# Patient Record
Sex: Female | Born: 1995 | Race: White | Hispanic: No | Marital: Single | Smoking: Never smoker
Health system: Southern US, Community
[De-identification: ages and names within clinical notes are randomized; demographics above are authoritative.]

## PROBLEM LIST (undated history)

## (undated) DIAGNOSIS — F419 Anxiety disorder, unspecified: Secondary | ICD-10-CM

## (undated) DIAGNOSIS — K219 Gastro-esophageal reflux disease without esophagitis: Secondary | ICD-10-CM

## (undated) DIAGNOSIS — Z9889 Other specified postprocedural states: Secondary | ICD-10-CM

## (undated) DIAGNOSIS — M542 Cervicalgia: Secondary | ICD-10-CM

## (undated) DIAGNOSIS — F32A Depression, unspecified: Secondary | ICD-10-CM

## (undated) DIAGNOSIS — R112 Nausea with vomiting, unspecified: Secondary | ICD-10-CM

## (undated) DIAGNOSIS — R0600 Dyspnea, unspecified: Secondary | ICD-10-CM

## (undated) DIAGNOSIS — Z87442 Personal history of urinary calculi: Secondary | ICD-10-CM

## (undated) HISTORY — PX: WISDOM TOOTH EXTRACTION: SHX21

---
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
## 2013-06-23 ENCOUNTER — Telehealth: Payer: Self-pay | Admitting: Family Medicine

## 2013-06-23 ENCOUNTER — Ambulatory Visit (INDEPENDENT_AMBULATORY_CARE_PROVIDER_SITE_OTHER): Payer: Commercial Indemnity | Admitting: Family Medicine

## 2013-06-23 VITALS — BP 107/73 | HR 76 | Temp 99.1°F | Ht 61.0 in | Wt 103.6 lb

## 2013-06-23 DIAGNOSIS — J069 Acute upper respiratory infection, unspecified: Secondary | ICD-10-CM

## 2013-06-23 DIAGNOSIS — H109 Unspecified conjunctivitis: Secondary | ICD-10-CM

## 2013-06-23 MED ORDER — AZITHROMYCIN 250 MG PO TABS: ORAL_TABLET | ORAL | Status: DC

## 2013-06-23 MED ORDER — POLYMYXIN B-TRIMETHOPRIM 10000-0.1 UNIT/ML-% OP SOLN: 1.0000 [drp] | OPHTHALMIC | Status: DC

## 2013-06-23 NOTE — Progress Notes (Signed)
   Subjective:    Patient ID: Alice Ward, female    DOB: 21-Dec-1995, 18 y.o.   MRN: [id number]  HPI This 18 y.o. female presents for evaluation of uri sx's and bilateral eye itching and allergies.  She has been taking nasal spray, antihistamines and singulair and it is not working and she thinks she might want to see an allergist but wants to talk to her father first.   Review of Systems C/o allergy sx's No chest pain, SOB, HA, dizziness, vision change, N/V, diarrhea, constipation, dysuria, urinary urgency or frequency, myalgias, arthralgias or rash.     Objective:   Physical Exam  Vital signs noted  Well developed well nourished female.  HEENT - Head atraumatic Normocephalic                Eyes - PERRLA, Conjuctiva - clear Sclera- Clear EOMI                Ears - EAC's Wnl TM's Wnl Gross Hearing WNL                Nose - Nares patent                 Throat - oropharanx wnl Respiratory - Lungs CTA bilateral Cardiac - RRR S1 and S2 without murmur GI - Abdomen soft Nontender and bowel sounds active x 4 Extremities - No edema. Neuro - Grossly intact.      Assessment & Plan:  URI (upper respiratory infection) - Plan: azithromycin (ZITHROMAX) 250 MG tablet, trimethoprim-polymyxin b (POLYTRIM) ophthalmic solution  Conjunctivitis, unspecified laterality - Plan: azithromycin (ZITHROMAX) 250 MG tablet, trimethoprim-polymyxin b (POLYTRIM) ophthalmic solution  SAR - Discussed referral to allergist but she wants to wait and discuss with her father.  Deatra CanterWilliam J Oxford FNP

## 2013-06-24 ENCOUNTER — Other Ambulatory Visit: Payer: Self-pay | Admitting: Family Medicine

## 2013-06-24 DIAGNOSIS — J302 Other seasonal allergic rhinitis: Secondary | ICD-10-CM

## 2013-08-19 ENCOUNTER — Ambulatory Visit (INDEPENDENT_AMBULATORY_CARE_PROVIDER_SITE_OTHER): Payer: Managed Care, Other (non HMO) | Admitting: Internal Medicine

## 2013-08-19 ENCOUNTER — Encounter (INDEPENDENT_AMBULATORY_CARE_PROVIDER_SITE_OTHER): Payer: Self-pay

## 2013-08-19 ENCOUNTER — Other Ambulatory Visit: Payer: Managed Care, Other (non HMO)

## 2013-08-19 ENCOUNTER — Encounter: Payer: Self-pay | Admitting: Internal Medicine

## 2013-08-19 VITALS — BP 116/60 | HR 87 | Ht 61.0 in | Wt 102.8 lb

## 2013-08-19 DIAGNOSIS — H1012 Acute atopic conjunctivitis, left eye: Secondary | ICD-10-CM

## 2013-08-19 DIAGNOSIS — H049 Disorder of lacrimal system, unspecified: Secondary | ICD-10-CM

## 2013-08-19 DIAGNOSIS — H1045 Other chronic allergic conjunctivitis: Secondary | ICD-10-CM

## 2013-08-19 NOTE — Patient Instructions (Signed)
Order- lab- Allergy profile    Dx allergic conjunctivitis  Recommend you see an Opthalmologist (MD) about your overflow lacrimation. I don't expect this to turn out to be an allergy problem affecting just one eye over so many years, but see what the eye doctor thinks.

## 2013-08-19 NOTE — Progress Notes (Signed)
08/19/13- 18 yoF never smoker referred courtesy of Dr Nils Pyle; left eye waters year round;has tried nasal sprays, antihistamines, and eye drops with no relief. Also has tried going without make up for period of time and this was not the cause of watery eyes.  Mother here She and her mother say that the left eye has watered since she was a small child. Is worse in wind but otherwise no seasonal pattern or exposure seems significant. She's had minor seasonal nasal congestion with no history of significant a copy including hives or asthma. She had had some minor trauma with a fall striking the bridge of her nose many years ago. Wisdom teeth extracted otherwise no ENT surgery. Other medical and family history is unremarkable.  She denies pregnancy  Prior to Admission medications   Not on File   Past Medical History  Diagnosis Date  . Neck pain    Past Surgical History  Procedure Laterality Date  . Wisdom tooth extraction     Family History  Problem Relation Age of Onset  . Cancer Mother   . Allergies Mother   . Allergies      both sets of grandparents  . Heart disease      mother's side-grandparents  . Cancer      both sets of grandparents (bone,lung,pancreas,skin)   History   Social History  . Marital Status: Single    Spouse Name: N/A    Number of Children: 0  . Years of Education: N/A   Occupational History  . cashier    Social History Main Topics  . Smoking status: Never Smoker   . Smokeless tobacco: Never Used  . Alcohol Use: No  . Drug Use: No  . Sexual Activity: Yes    Birth Control/ Protection: Condom   Other Topics Concern  . Not on file   Social History Narrative  . No narrative on file   ROS-see HPI Constitutional:   No-   weight loss, night sweats, fevers, chills, fatigue, lassitude. HEENT:   No-  headaches, difficulty swallowing, tooth/dental problems, sore throat,       No-  sneezing, itching, ear ache, nasal congestion, post nasal drip,  CV:   No-   chest pain, orthopnea, PND, swelling in lower extremities, anasarca,                                  dizziness, palpitations Resp: No-   shortness of breath with exertion or at rest.              No-   productive cough,  No non-productive cough,  No- coughing up of blood.              No-   change in color of mucus.  No- wheezing.   Skin: No-   rash or lesions. GI:  No-   heartburn, indigestion, abdominal pain, nausea, vomiting, diarrhea,                 change in bowel habits, loss of appetite GU: No-   dysuria, change in color of urine, no urgency or frequency.  No- flank pain. MS:  No-   joint pain or swelling.  No- decreased range of motion.  No- back pain. Neuro-     nothing unusual Psych:  No- change in mood or affect. No depression or anxiety.  No memory loss.  OBJ- Physical Exam General- Alert,  Oriented, Affect-appropriate, Distress- none acute Skin- rash-none, lesions- none, excoriation- none. [place]. Lymphadenopathy- none Head- atraumatic            Eyes- Gross vision intact, PERRLA, conjunctivae and secretions clear. Not obviously                          irritated or watery            Ears- Hearing, canals-normal            Nose- Clear, no-Septal dev, mucus, polyps, erosion, perforation             Throat- Mallampati II , mucosa clear , drainage- none, tonsils- atrophic, +tongue stud Neck- flexible , trachea midline, no stridor , thyroid nl, carotid no bruit Chest - symmetrical excursion , unlabored           Heart/CV- RRR , no murmur , no gallop  , no rub, nl s1 s2                           - JVD- none , edema- none, stasis changes- none, varices- none           Lung- clear to P&A, wheeze- none, cough- none , dullness-none, rub- none           Chest wall-  Abd- tender-no, distended-no, bowel sounds-present, HSM- no Br/ Gen/ Rectal- Not done, not indicated Extrem- cyanosis- none, clubbing, none, atrophy- none, strength- nl Neuro- grossly intact to  observation

## 2013-08-20 LAB — ALLERGY FULL PROFILE
Allergen,Goose feathers, e70: <0.1 kU/L
Alternaria Alternata: <0.1 kU/L
Bahia Grass: <0.1 kU/L
Cat Dander: <0.1 kU/L
Common Ragweed: <0.1 kU/L
Curvularia lunata: <0.1 kU/L
Dog Dander: <0.1 kU/L
Fescue: <0.1 kU/L
Goldenrod: <0.1 kU/L
Helminthosporium halodes: <0.1 kU/L
IgE (Immunoglobulin E), Serum: 19.6 IU/mL (ref 0.0–180.0)
Lamb's Quarters: <0.1 kU/L
Stemphylium Botryosum: <0.1 kU/L
Sycamore Tree: <0.1 kU/L

## 2013-08-24 DIAGNOSIS — H049 Disorder of lacrimal system, unspecified: Secondary | ICD-10-CM | POA: Insufficient documentation

## 2013-08-24 NOTE — Assessment & Plan Note (Signed)
I doubt this is an atopic problem. We can check an allergy profile for IgE antibody panel, but I have recommended referral to an ophthalmologist. 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

## 2013-09-09 ENCOUNTER — Ambulatory Visit: Payer: Commercial Indemnity

## 2013-09-15 ENCOUNTER — Ambulatory Visit (INDEPENDENT_AMBULATORY_CARE_PROVIDER_SITE_OTHER): Payer: Commercial Indemnity | Admitting: *Deleted

## 2013-09-15 DIAGNOSIS — Z111 Encounter for screening for respiratory tuberculosis: Secondary | ICD-10-CM

## 2013-09-15 NOTE — Patient Instructions (Signed)

## 2013-09-17 LAB — TB SKIN TEST
Induration: 0 mm
TB SKIN TEST: NEGATIVE

## 2013-09-23 ENCOUNTER — Ambulatory Visit: Payer: Commercial Indemnity | Admitting: Nurse Practitioner

## 2013-09-25 ENCOUNTER — Ambulatory Visit (INDEPENDENT_AMBULATORY_CARE_PROVIDER_SITE_OTHER): Payer: Commercial Indemnity | Admitting: Nurse Practitioner

## 2013-09-25 ENCOUNTER — Encounter: Payer: Self-pay | Admitting: Nurse Practitioner

## 2013-09-25 VITALS — BP 104/70 | HR 78 | Temp 97.6°F | Ht 61.0 in | Wt 102.0 lb

## 2013-09-25 DIAGNOSIS — Z0000 Encounter for general adult medical examination without abnormal findings: Secondary | ICD-10-CM

## 2013-09-25 DIAGNOSIS — Z0289 Encounter for other administrative examinations: Secondary | ICD-10-CM

## 2013-09-25 NOTE — Patient Instructions (Signed)

## 2013-09-25 NOTE — Progress Notes (Signed)
   Subjective:    Patient ID: Alice Ward, female    DOB: 22-Mar-1995, 18 y.o.   MRN: [id number]  HPI Patient in to have a physical for work. SHe has no medical problems and is on no meds.    Review of Systems  Constitutional: Negative.   HENT: Negative.   Eyes: Positive for discharge.  Respiratory: Negative.   Cardiovascular: Negative.   Genitourinary: Negative.   Neurological: Negative.   Psychiatric/Behavioral: Negative.   All other systems reviewed and are negative.      Objective:   Physical Exam  Constitutional: She is oriented to person, place, and time. She appears well-developed and well-nourished.  Cardiovascular: Normal rate.   Pulmonary/Chest: Effort normal and breath sounds normal.  Neurological: She is alert and oriented to person, place, and time.  Skin: Skin is warm and dry.  Psychiatric: She has a normal mood and affect. Her behavior is normal. Judgment and thought content normal.   BP 104/70  Pulse 78  Temp(Src) 97.6 F (36.4 C) (Oral)  Ht  (1.549 m)  Wt 102 lb (46.267 kg)  BMI 19.28 kg/m2        Assessment & Plan:   1. Other general medical examination for administrative purposes    Exercise Low fat diet Follow up in 1 year and prn  Mary-Margaret Daphine Deutscher, FNP

## 2014-08-03 IMAGING — CR DG CERVICAL SPINE COMPLETE 4+V
5 series · 5 of 5 positions shown · non-contrast
Comparison: None.

CLINICAL DATA: Burning on the left side of the neck.

CERVICAL SPINE - COMPLETE 4+ VIEW

[view not recorded (1 of 5)]
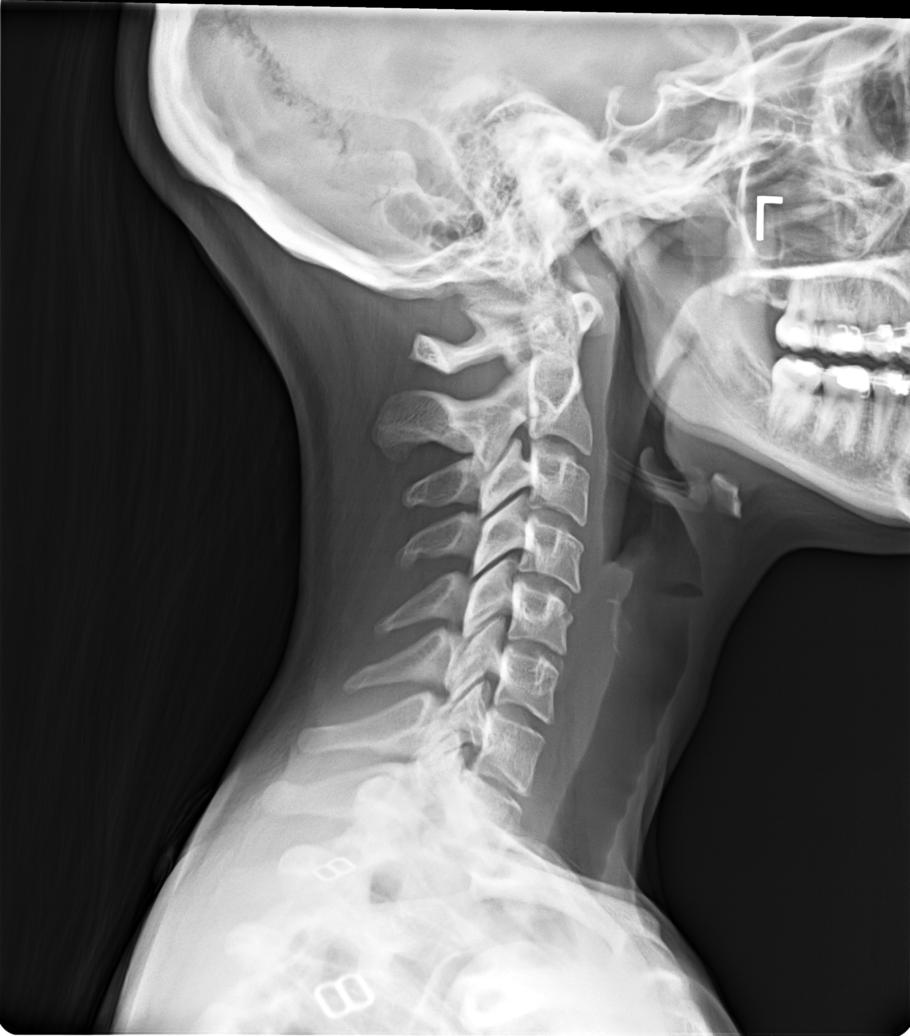

[view not recorded (2 of 5)]
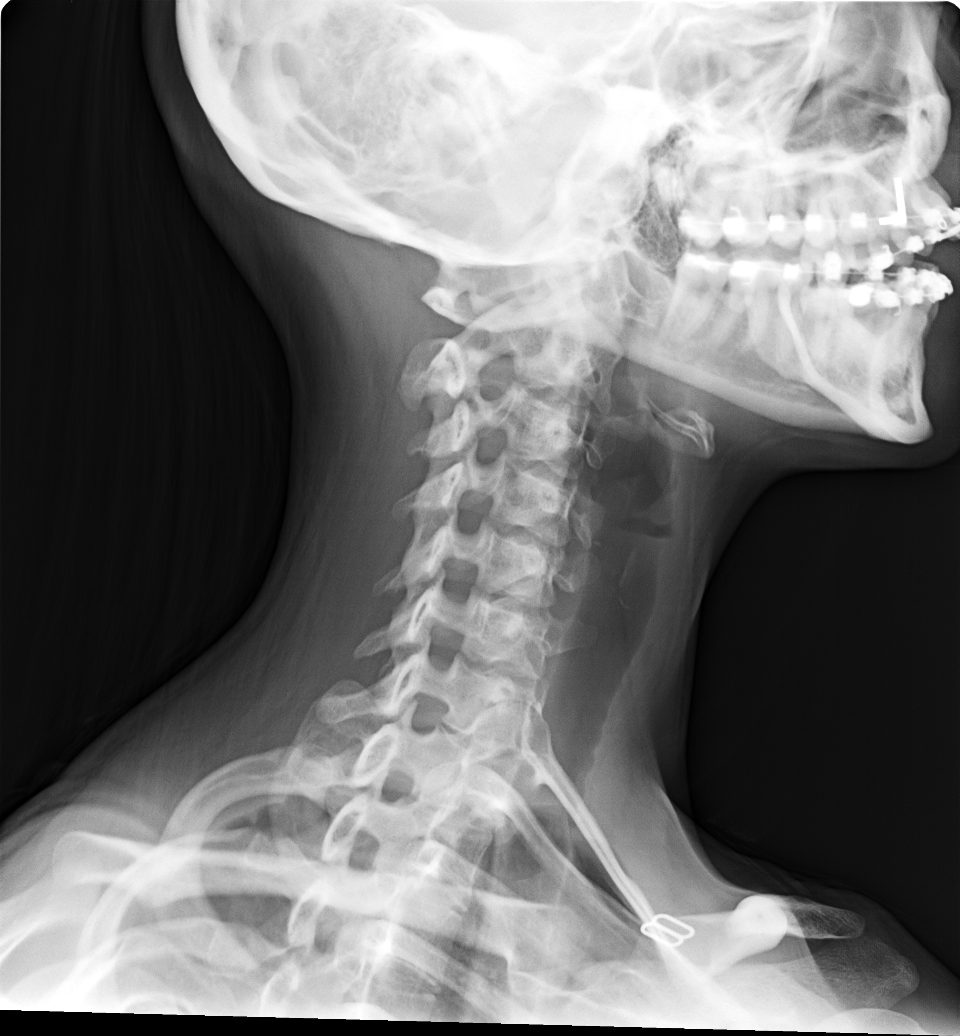

[view not recorded (3 of 5)]
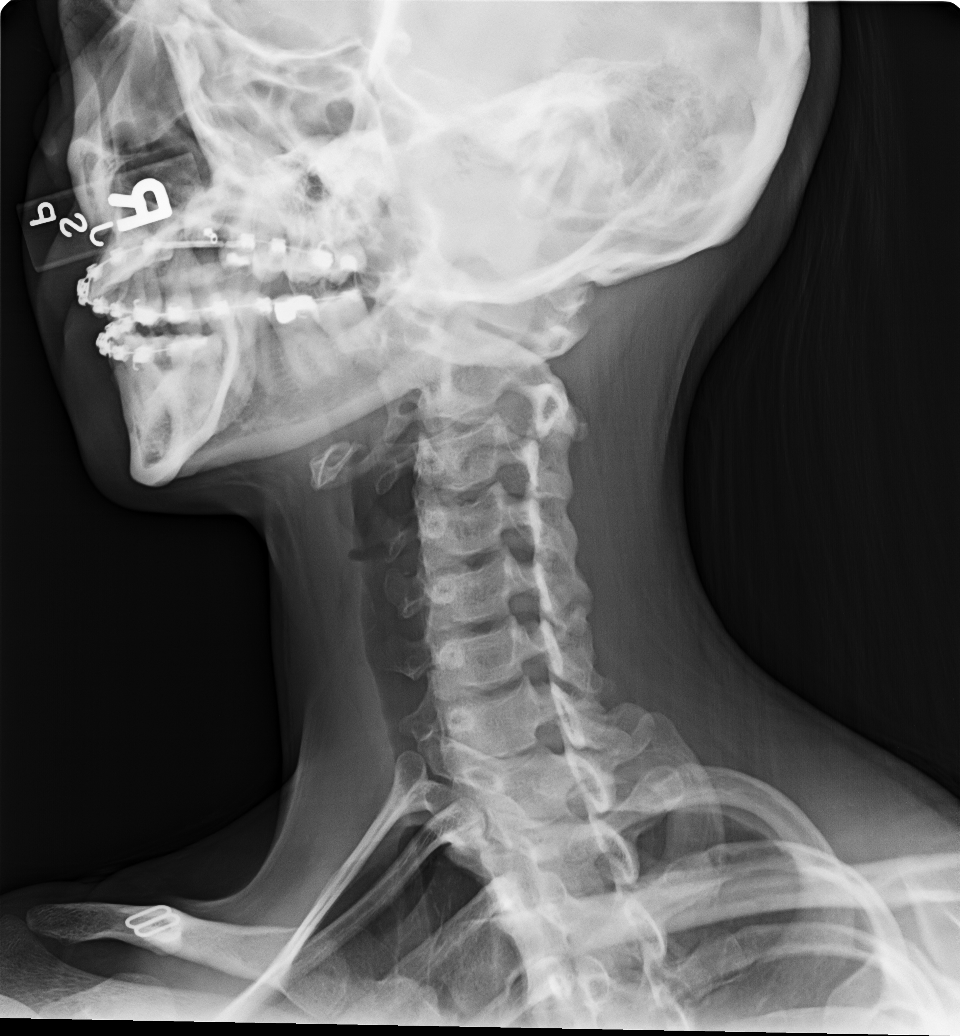

[view not recorded (4 of 5)]
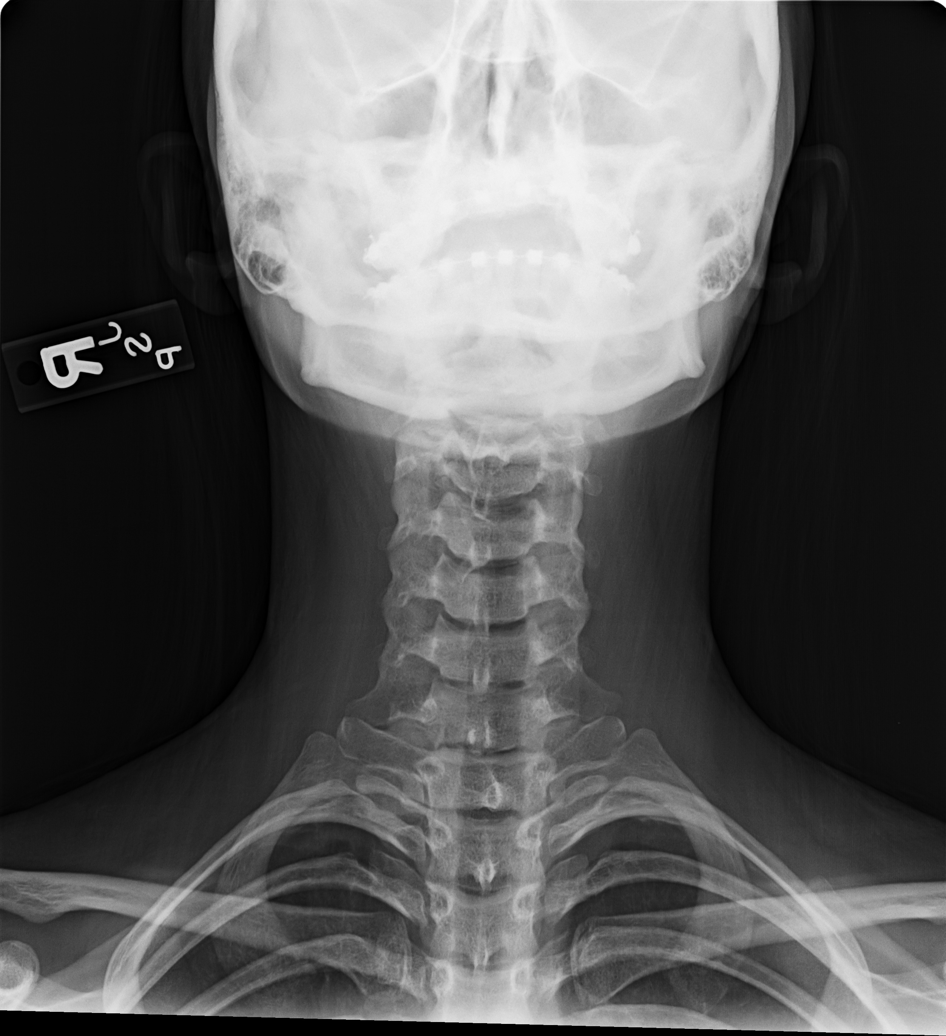

[view not recorded (5 of 5)]
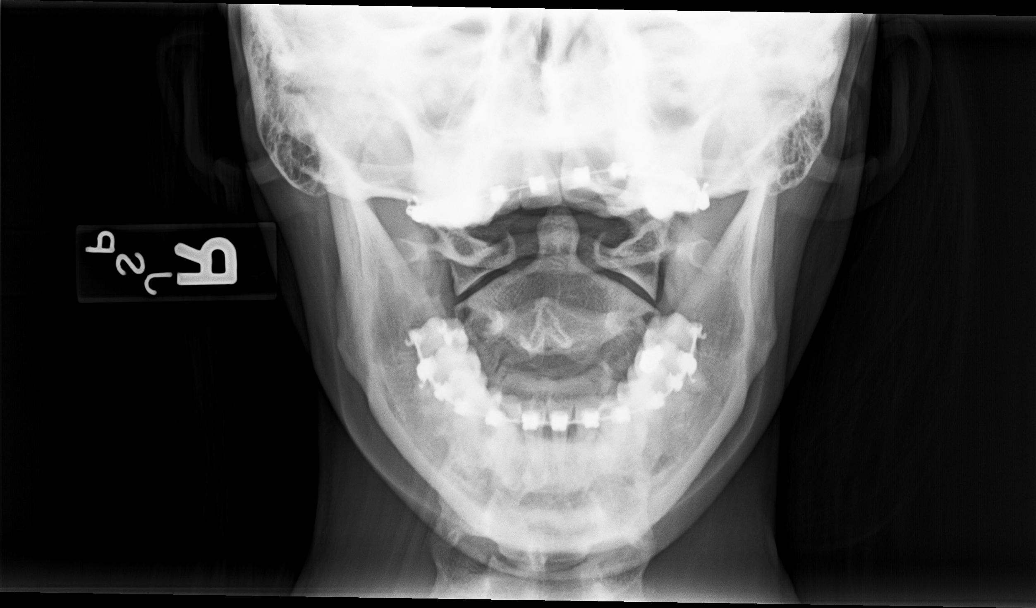

[5 of 5 positions shown; findings below may reference images not displayed]

FINDINGS: Anatomic alignment.  No fracture.  Prevertebral soft
tissues are normal.  Cervicothoracic junction appears normal.
Craniocervical alignment normal.  No bony foraminal stenosis.
IMPRESSION: Negative.

## 2014-11-11 ENCOUNTER — Encounter: Payer: Self-pay | Admitting: Family Medicine

## 2014-11-11 ENCOUNTER — Ambulatory Visit (INDEPENDENT_AMBULATORY_CARE_PROVIDER_SITE_OTHER): Payer: Commercial Indemnity | Admitting: Family Medicine

## 2014-11-11 VITALS — BP 126/85 | HR 142 | Temp 103.1°F | Ht 61.0 in | Wt 119.6 lb

## 2014-11-11 DIAGNOSIS — J029 Acute pharyngitis, unspecified: Secondary | ICD-10-CM | POA: Diagnosis not present

## 2014-11-11 DIAGNOSIS — J329 Chronic sinusitis, unspecified: Secondary | ICD-10-CM | POA: Diagnosis not present

## 2014-11-11 DIAGNOSIS — J40 Bronchitis, not specified as acute or chronic: Secondary | ICD-10-CM

## 2014-11-11 LAB — POCT RAPID STREP A (OFFICE): RAPID STREP A SCREEN: NEGATIVE

## 2014-11-11 MED ORDER — LEVOFLOXACIN 500 MG PO TABS: 500.0000 mg | ORAL_TABLET | Freq: Every day | ORAL | Status: DC

## 2014-11-11 MED ORDER — GUAIFENESIN-CODEINE 100-10 MG/5ML PO SYRP: 5.0000 mL | ORAL_SOLUTION | ORAL | Status: DC | PRN

## 2014-11-11 NOTE — Patient Instructions (Signed)
Rest at home, out of work through 11/13/2014

## 2014-11-11 NOTE — Progress Notes (Signed)
Subjective:  Patient ID: Hazeline Bachrach, female    DOB: [birth year]  Age: 19 y.o. MRN: [id number]  CC: Sore Throat   HPI Jennifer Knueppel presents for Patient presents with upper respiratory congestion. Rhinorrhea that is frequently purulent. Had some blood this AM early.There is severe sore throat. Patient reports coughing frequently as well.-colored/purulent sputum noted. There is fever &chills & sweats. The patient denies being short of breath. Onset was 3 days ago. Gradually worsening in spite of home remedies.   History Angala has a past medical history of Neck pain.   She has past surgical history that includes Wisdom tooth extraction.   Her family history includes Allergies in her mother and another family member; Cancer in her mother and another family member; Heart disease in an other family member.She reports that she has never smoked. She has never used smokeless tobacco. She reports that she does not drink alcohol or use illicit drugs.  No outpatient prescriptions prior to visit.   No facility-administered medications prior to visit.    ROS Review of Systems  Constitutional: Negative for fever, chills, activity change and appetite change.  HENT: Positive for congestion, postnasal drip, rhinorrhea and sinus pressure. Negative for ear discharge, ear pain, hearing loss, nosebleeds, sneezing and trouble swallowing.   Respiratory: Negative for chest tightness and shortness of breath.   Cardiovascular: Negative for chest pain and palpitations.  Skin: Negative for rash.    Objective:  BP 126/85 mmHg  Pulse 142  Temp(Src) 103.1 F (39.5 C) (Oral)  Ht 5\' 1"  (1.549 m)  Wt 119 lb 9.6 oz (54.25 kg)  BMI 22.61 kg/m2  SpO2 99%  LMP 10/11/2014  BP Readings from Last 3 Encounters:  11/11/14 126/85  09/25/13 104/70  08/19/13 116/60    Wt Readings from Last 3 Encounters:  11/11/14 119 lb 9.6 oz (54.25 kg)  09/25/13 102 lb (46.267 kg)  08/19/13 102 lb 12.8 oz (46.63 kg)      Physical Exam  Constitutional: She appears well-developed and well-nourished.  HENT:  Head: Normocephalic and atraumatic.  Right Ear: Tympanic membrane and external ear normal. No decreased hearing is noted.  Left Ear: Tympanic membrane and external ear normal. No decreased hearing is noted.  Nose: Mucosal edema present. Right sinus exhibits no frontal sinus tenderness. Left sinus exhibits no frontal sinus tenderness.  Mouth/Throat: No oropharyngeal exudate or posterior oropharyngeal erythema.  Pharynx bright red in streaks  Neck: No Brudzinski's sign noted.  Cardiovascular: Normal rate and regular rhythm.   Pulmonary/Chest: Breath sounds normal. No respiratory distress.  Lymphadenopathy:       Head (right side): No preauricular adenopathy present.       Head (left side): No preauricular adenopathy present.       Right cervical: No superficial cervical adenopathy present.      Left cervical: No superficial cervical adenopathy present.    No results found for: HGBA1C  No results found for: WBC, HGB, HCT, PLT, GLUCOSE, CHOL, TRIG, HDL, LDLDIRECT, LDLCALC, ALT, AST, NA, K, CL, CREATININE, BUN, CO2, TSH, PSA, INR, GLUF, HGBA1C, MICROALBUR  Mr Cervical Spine Wo Contrast  02/12/2012  *RADIOLOGY REPORT* Clinical Data: Neck pain radiating to the left arm. MRI CERVICAL SPINE WITHOUT CONTRAST Technique:  Multiplanar and multiecho pulse sequences of the cervical spine, to include the craniocervical junction and cervicothoracic junction, were obtained according to standard protocol without intravenous contrast. Comparison: None. Findings: There is artifact about the upper margin of the study from the patient's braces.  There is  also motion on the study. Given these limitations, vertebral body height, signal and alignment appear normal.  The craniocervical junction appears normal.  Cervical cord signal is unremarkable.  No disc bulge or protrusion is identified.  The central spinal canal and  neural foramina appear widely patent at all levels.  Paraspinous structures are unremarkable. IMPRESSION: Negative examination. Original Report Authenticated By: Holley Dexter, M.D.    Assessment & Plan:   Ariebella was seen today for sore throat.  Diagnoses and all orders for this visit:  Sore throat -     POCT rapid strep A -     Culture, Group A Strep  Sinobronchitis -     Culture, Group A Strep  Other orders -     levofloxacin (LEVAQUIN) 500 MG tablet; Take 1 tablet (500 mg total) by mouth daily. -     guaiFENesin-codeine (CHERATUSSIN AC) 100-10 MG/5ML syrup; Take 5 mLs by mouth every 4 (four) hours as needed for cough.   I am having Ms. Canady start on levofloxacin and guaiFENesin-codeine.  Meds ordered this encounter  Medications  . levofloxacin (LEVAQUIN) 500 MG tablet    Sig: Take 1 tablet (500 mg total) by mouth daily.    Dispense:  10 tablet    Refill:  0  . guaiFENesin-codeine (CHERATUSSIN AC) 100-10 MG/5ML syrup    Sig: Take 5 mLs by mouth every 4 (four) hours as needed for cough.    Dispense:  180 mL    Refill:  0     Follow-up: Return if symptoms worsen or fail to improve.  Mechele Claude, M.D.

## 2014-11-13 LAB — CULTURE, GROUP A STREP: STREP A CULTURE: NEGATIVE

## 2015-08-25 ENCOUNTER — Other Ambulatory Visit: Payer: Self-pay | Admitting: Family Medicine

## 2015-08-25 ENCOUNTER — Telehealth: Payer: Self-pay | Admitting: Family Medicine

## 2015-08-25 MED ORDER — PROMETHAZINE HCL 25 MG PO TABS: 25.0000 mg | ORAL_TABLET | Freq: Three times a day (TID) | ORAL | 1 refills | Status: DC | PRN

## 2015-08-25 NOTE — Telephone Encounter (Signed)
Please review and advise.

## 2015-08-25 NOTE — Telephone Encounter (Signed)
The requested med has been sent to the pharmacy.  Please let the patient know. Thanks, WS 

## 2015-08-26 MED ORDER — PROMETHAZINE HCL 25 MG PO TABS: 25.0000 mg | ORAL_TABLET | Freq: Three times a day (TID) | ORAL | 1 refills | Status: DC | PRN

## 2016-04-05 ENCOUNTER — Ambulatory Visit: Payer: Managed Care, Other (non HMO) | Admitting: Family Medicine

## 2016-04-19 ENCOUNTER — Ambulatory Visit: Payer: Managed Care, Other (non HMO) | Admitting: Family Medicine

## 2018-09-30 ENCOUNTER — Other Ambulatory Visit: Payer: Self-pay

## 2018-09-30 DIAGNOSIS — Z20822 Contact with and (suspected) exposure to covid-19: Secondary | ICD-10-CM

## 2018-10-01 LAB — NOVEL CORONAVIRUS, NAA: SARS-CoV-2, NAA: NOT DETECTED

## 2019-02-11 ENCOUNTER — Other Ambulatory Visit: Payer: Self-pay

## 2019-02-11 ENCOUNTER — Emergency Department (HOSPITAL_COMMUNITY)
Admission: EM | Admit: 2019-02-11 | Discharge: 2019-02-12 | Disposition: A | Discharge status: Home / Self Care | Payer: 59 | Attending: Emergency Medicine | Admitting: Emergency Medicine

## 2019-02-11 ENCOUNTER — Encounter (HOSPITAL_COMMUNITY): Payer: Self-pay | Admitting: Emergency Medicine

## 2019-02-11 ENCOUNTER — Emergency Department (HOSPITAL_COMMUNITY): Payer: 59

## 2019-02-11 DIAGNOSIS — R0602 Shortness of breath: Secondary | ICD-10-CM | POA: Diagnosis not present

## 2019-02-11 DIAGNOSIS — R079 Chest pain, unspecified: Secondary | ICD-10-CM

## 2019-02-11 DIAGNOSIS — R0981 Nasal congestion: Secondary | ICD-10-CM

## 2019-02-11 DIAGNOSIS — R0789 Other chest pain: Secondary | ICD-10-CM | POA: Diagnosis present

## 2019-02-11 DIAGNOSIS — R000 Tachycardia, unspecified: Secondary | ICD-10-CM | POA: Diagnosis not present

## 2019-02-11 DIAGNOSIS — R9081 Abnormal echoencephalogram: Secondary | ICD-10-CM | POA: Insufficient documentation

## 2019-02-11 DIAGNOSIS — Z79899 Other long term (current) drug therapy: Secondary | ICD-10-CM | POA: Insufficient documentation

## 2019-02-11 DIAGNOSIS — Z20822 Contact with and (suspected) exposure to covid-19: Secondary | ICD-10-CM | POA: Insufficient documentation

## 2019-02-11 LAB — CBC
HCT: 40.8 % (ref 36.0–46.0)
Hemoglobin: 13.6 g/dL (ref 12.0–15.0)
MCH: 29.3 pg (ref 26.0–34.0)
MCHC: 33.3 g/dL (ref 30.0–36.0)
MCV: 87.9 fL (ref 80.0–100.0)
Platelets: 308 10*3/uL (ref 150–400)
RBC: 4.64 MIL/uL (ref 3.87–5.11)
RDW: 12.7 % (ref 11.5–15.5)
WBC: 6.7 10*3/uL (ref 4.0–10.5)
nRBC: 0 % (ref 0.0–0.2)

## 2019-02-11 LAB — BASIC METABOLIC PANEL
Anion gap: 8 (ref 5–15)
BUN: 10 mg/dL (ref 6–20)
CO2: 26 mmol/L (ref 22–32)
Calcium: 9.1 mg/dL (ref 8.9–10.3)
Chloride: 106 mmol/L (ref 98–111)
Creatinine, Ser: 0.63 mg/dL (ref 0.44–1.00)
GFR calc Af Amer: >60 mL/min (ref >60)
GFR calc non Af Amer: >60 mL/min (ref >60)
Glucose, Bld: 98 mg/dL (ref 70–99)
Potassium: 3.9 mmol/L (ref 3.5–5.1)
Sodium: 140 mmol/L (ref 135–145)

## 2019-02-11 LAB — I-STAT BETA HCG BLOOD, ED (MC, WL, AP ONLY): I-stat hCG, quantitative: <5 m[IU]/mL (ref <5)

## 2019-02-11 LAB — TROPONIN I (HIGH SENSITIVITY): Troponin I (High Sensitivity): 4 ng/L (ref <18)

## 2019-02-11 MED ORDER — SODIUM CHLORIDE 0.9% FLUSH: 3.0000 mL | Freq: Once | INTRAVENOUS | Status: DC

## 2019-02-11 NOTE — ED Triage Notes (Signed)
Pt to ED with c/o rapid heart rate and lower chest pain off and on since yesterday

## 2019-02-12 LAB — D-DIMER, QUANTITATIVE: D-Dimer, Quant: 0.37 ug/mL-FEU (ref 0.00–0.50)

## 2019-02-12 LAB — TROPONIN I (HIGH SENSITIVITY): Troponin I (High Sensitivity): <2 ng/L (ref <18)

## 2019-02-12 NOTE — ED Provider Notes (Signed)
[facility] EMERGENCY DEPARTMENT Provider Note   CSN: [id number] Arrival date & time: 02/11/19  2028     History Chief Complaint  Patient presents with  . Chest Pain    Alice Ward is a 24 y.o. female.  Patient presents to the emergency department with a chief complaint of chest pain.  She reports that she was seen in urgent care earlier this week after having had a rapid heart rate.  She states that her heart rate was in the 160s.  She reports having some shortness of breath.  She denies any stimulant use.  Denies any history of PE or DVT.  Denies any recent travel or surgery.  Denies any calf pain or leg swelling.  She states that urgent care encouraged her to have evaluation in the emergency department.  She states that she has had some sinus congestion, but denies any fever, chills, or cough.  The history is provided by the patient. No language interpreter was used.       Past Medical History:  Diagnosis Date  . Neck pain     Patient Active Problem List   Diagnosis Date Noted  . 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 WISDOM TOOTH EXTRACTION       OB History   No obstetric history on file.     Family History  Problem Relation Age of Onset  . Cancer Mother   . Allergies Mother   . Allergies Other        both sets of grandparents  . Heart disease Other        mother's side-grandparents  . Cancer Other        both sets of grandparents (bone,lung,pancreas,skin)    Social History   Tobacco Use  . Smoking status: Never Smoker  . Smokeless tobacco: Never Used  Substance Use Topics  . Alcohol use: No  . Drug use: No    Home Medications Prior to Admission medications   Medication Sig Start Date End Date Taking? Authorizing Provider  guaiFENesin-codeine (CHERATUSSIN AC) 100-10 MG/5ML syrup Take 5 mLs by mouth every 4 (four) hours as needed for cough. 11/11/14   Claretta Fraise, MD    levofloxacin (LEVAQUIN) 500 MG tablet Take 1 tablet (500 mg total) by mouth daily. 11/11/14   Claretta Fraise, MD  promethazine (PHENERGAN) 25 MG tablet Take 1 tablet (25 mg total) by mouth every 8 (eight) hours as needed for nausea or vomiting. 08/26/15   Claretta Fraise, MD    Allergies    Amoxicillin and Penicillins  Review of Systems   Review of Systems  All other systems reviewed and are negative.   Physical Exam Updated Vital Signs BP 116/86 (BP Location: Left Arm)   Pulse 95   Temp 98.1 F (36.7 C) (Oral)   Resp 18   Ht 5\' 5"  (1.651 m)   Wt 61.2 kg   LMP 01/16/2019 (Exact Date)   SpO2 99%   BMI 22.47 kg/m   Physical Exam Vitals and nursing note reviewed.  Constitutional:      General: She is not in acute distress.    Appearance: She is well-developed.  HENT:     Head: Normocephalic and atraumatic.  Eyes:     Conjunctiva/sclera: Conjunctivae normal.  Cardiovascular:     Rate and Rhythm: Regular rhythm.     Heart sounds: No murmur.     Comments: Mild tachycardia to the low 100s Pulmonary:  Effort: Pulmonary effort is normal. No respiratory distress.     Breath sounds: Normal breath sounds.  Abdominal:     Palpations: Abdomen is soft.     Tenderness: There is no abdominal tenderness.  Musculoskeletal:        General: Normal range of motion.     Cervical back: Neck supple.  Skin:    General: Skin is warm and dry.  Neurological:     Mental Status: She is alert and oriented to person, place, and time.  Psychiatric:        Mood and Affect: Mood normal.        Behavior: Behavior normal.     ED Results / Procedures / Treatments   Labs (all labs ordered are listed, but only abnormal results are displayed) Labs Reviewed  BASIC METABOLIC PANEL  CBC  D-DIMER, QUANTITATIVE (NOT AT [facility])  I-STAT BETA HCG BLOOD, ED ([facility], [facility], [facility] ONLY)  TROPONIN I (HIGH SENSITIVITY)  TROPONIN I (HIGH SENSITIVITY)    EKG EKG Interpretation  Date/Time:  Tuesday February 11 2019 21:34:44 EST Ventricular Rate:  98 PR Interval:  96 QRS Duration: 84 QT Interval:  340 QTC Calculation: 434 R Axis:   96 Text Interpretation: Sinus rhythm with short PR Rightward axis Nonspecific T wave abnormality Abnormal ECG No old tracing to compare Confirmed by Ward, Baxter Hire [id number]) on 02/12/2019 1:52:30 AM   Radiology DG Chest 2 View  Result Date: 02/11/2019 CLINICAL DATA:  Tachycardia, intermittent lower chest pain for 1 day EXAM: CHEST - 2 VIEW COMPARISON:  None. FINDINGS: The heart size and mediastinal contours are within normal limits. Both lungs are clear. The visualized skeletal structures are unremarkable. IMPRESSION: No active cardiopulmonary disease. Electronically Signed   By: Sharlet Salina M.D.   On: 02/11/2019 21:58    Procedures Procedures (including critical care time)  Medications Ordered in ED Medications  sodium chloride flush (NS) 0.9 % injection 3 mL (has no administration in time range)    ED Course  I have reviewed the triage vital signs and the nursing notes.  Pertinent labs & imaging results that were available during my care of the patient were reviewed by me and considered in my medical decision making (see chart for details).    MDM Rules/Calculators/A&P                     Patient with chest pain x3 days.  She has had some shortness of breath as well.  Reports rapid heart rate earlier this week, and was seen in urgent care.  She states that her heart rate was in the 160s.  She was encouraged to follow-up in the emergency department to be evaluated for PE.  I think that PE is less likely.  She is not hypoxic.  Wells PE score is 1.5, indicating low risk group.  D-dimer is 0.37.  EKG shows no acute ischemic changes or concerning arrhythmias.  She may benefit from following up with cardiology and wearing a heart monitor.  I have advised her to avoid stimulants.  We will also check Covid given her sinus congestion symptoms.  Plan for  discharge.   Final Clinical Impression(s) / ED Diagnoses Final diagnoses:  Nonspecific chest pain  Tachycardia  Sinus congestion    Rx / DC Orders ED Discharge Orders    None       Roxy Horseman, PA-C 02/12/19 0353    Ward, Layla Maw, DO 02/12/19 [phone]

## 2019-02-12 NOTE — Discharge Instructions (Addendum)
You should isolate at home until your coronavirus test results.  If you have new or worsening symptoms, please return to the emergency department.  Please contact the cardiology group listed above, you may need to be evaluated by them and wear a heart monitor for further work-up.

## 2019-02-12 NOTE — ED Notes (Signed)
Pt came to the ED per triage complaint. Pt conscious, breathing, and A&Ox4. Pt ambulated to bay 32 with a smooth and steady gait. Pt endorses "I had a fluttering in my chest yesterday while at work that lasted two hours". Chest rise and fall equally with non-labored breathing. Lungs clear apex to base. Abd soft and non-tender. Pt denies chest pain, n/v/d, shortness of breath, and f/c. Bed in lowest position with call light within reach. Pt on continuous blood pressure, pulse ox, and cardiac monitor. Will continue to monitor. Awaiting MD eval. No distress noted.

## 2019-02-13 LAB — NOVEL CORONAVIRUS, NAA (HOSP ORDER, SEND-OUT TO REF LAB; TAT 18-24 HRS): SARS-CoV-2, NAA: NOT DETECTED

## 2020-09-30 IMAGING — DX DG CHEST 2V
2 series · 2 of 2 positions shown · non-contrast
Comparison: None.

CLINICAL DATA: Tachycardia, intermittent lower chest pain for 1 day

EXAM:
CHEST - 2 VIEW

[chest pa]
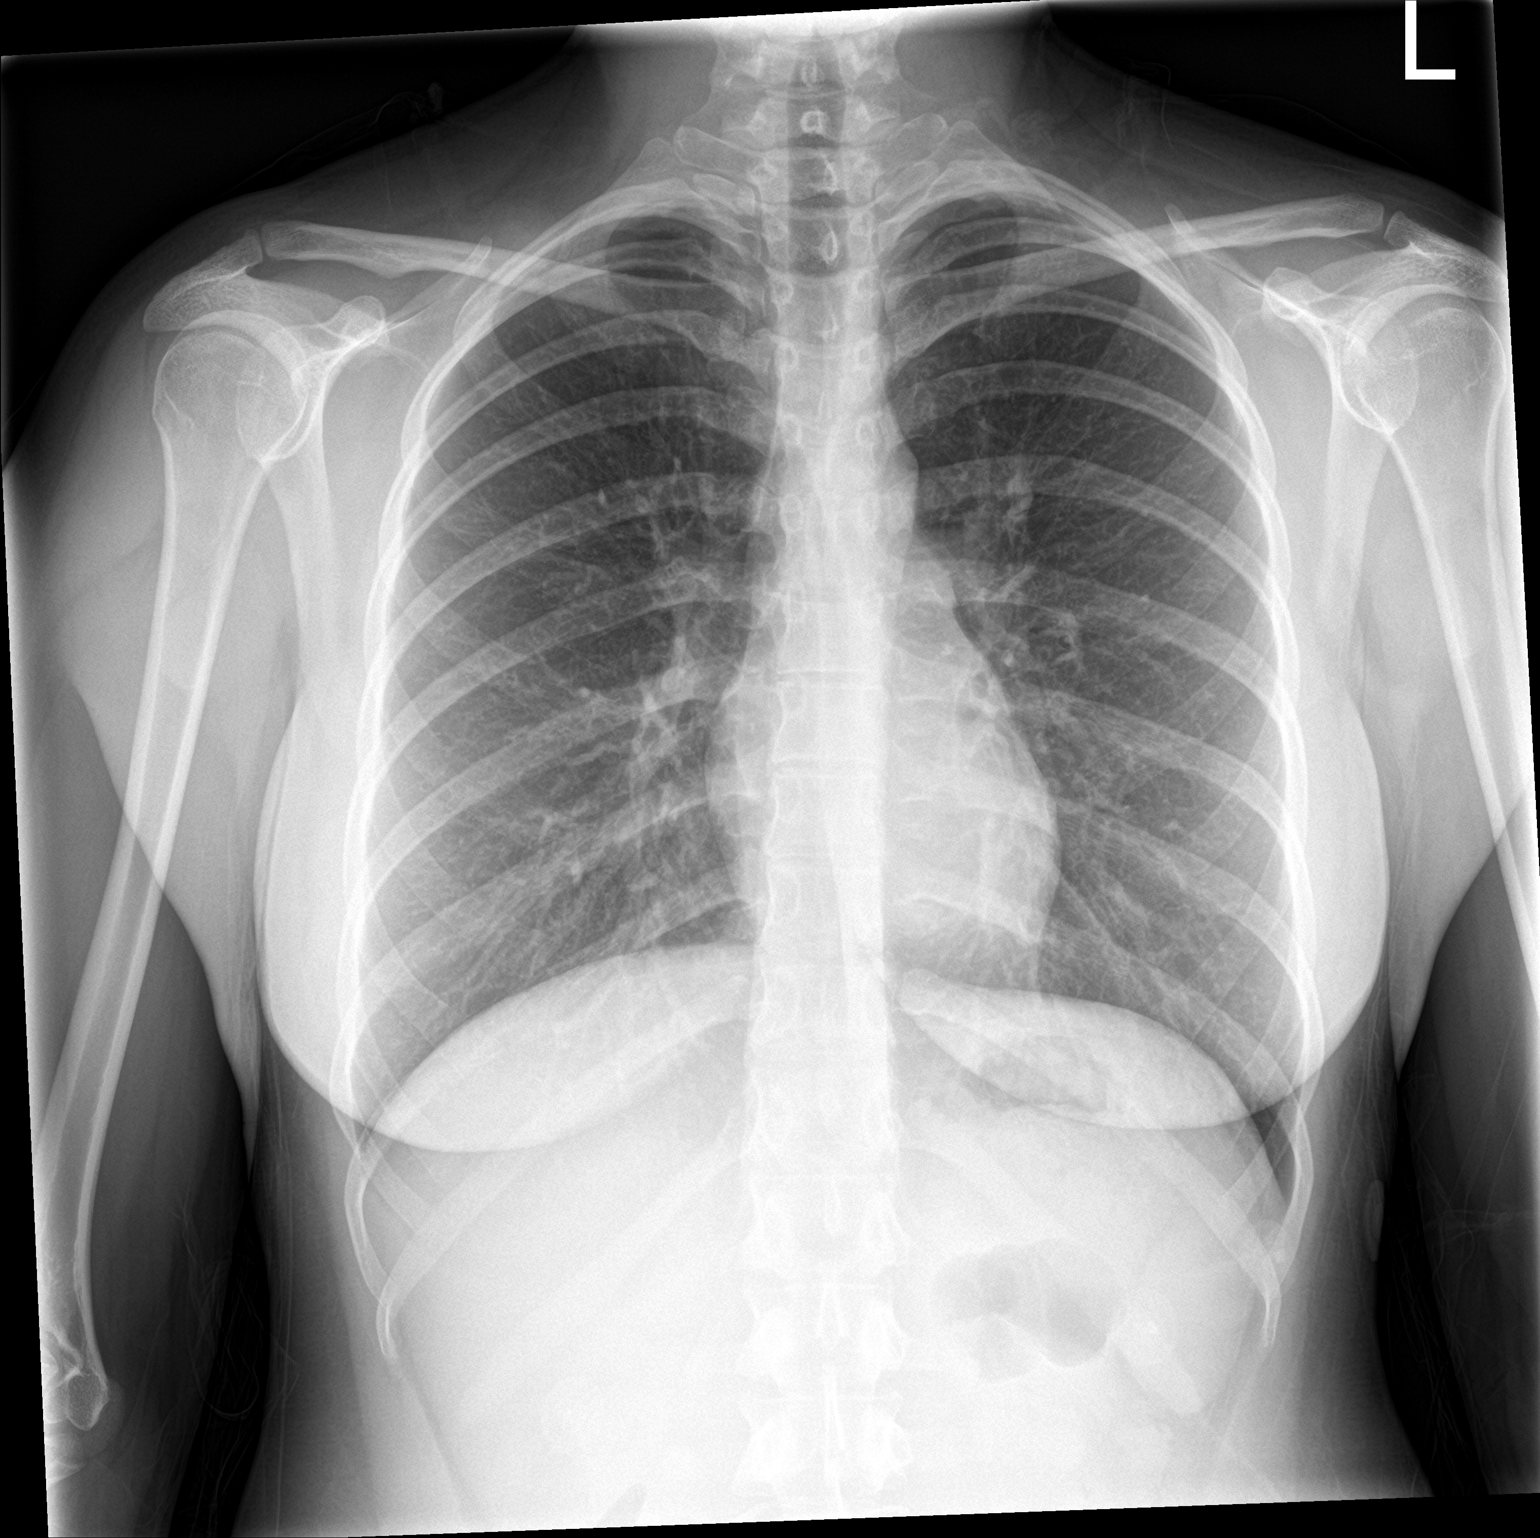

[chest lat]
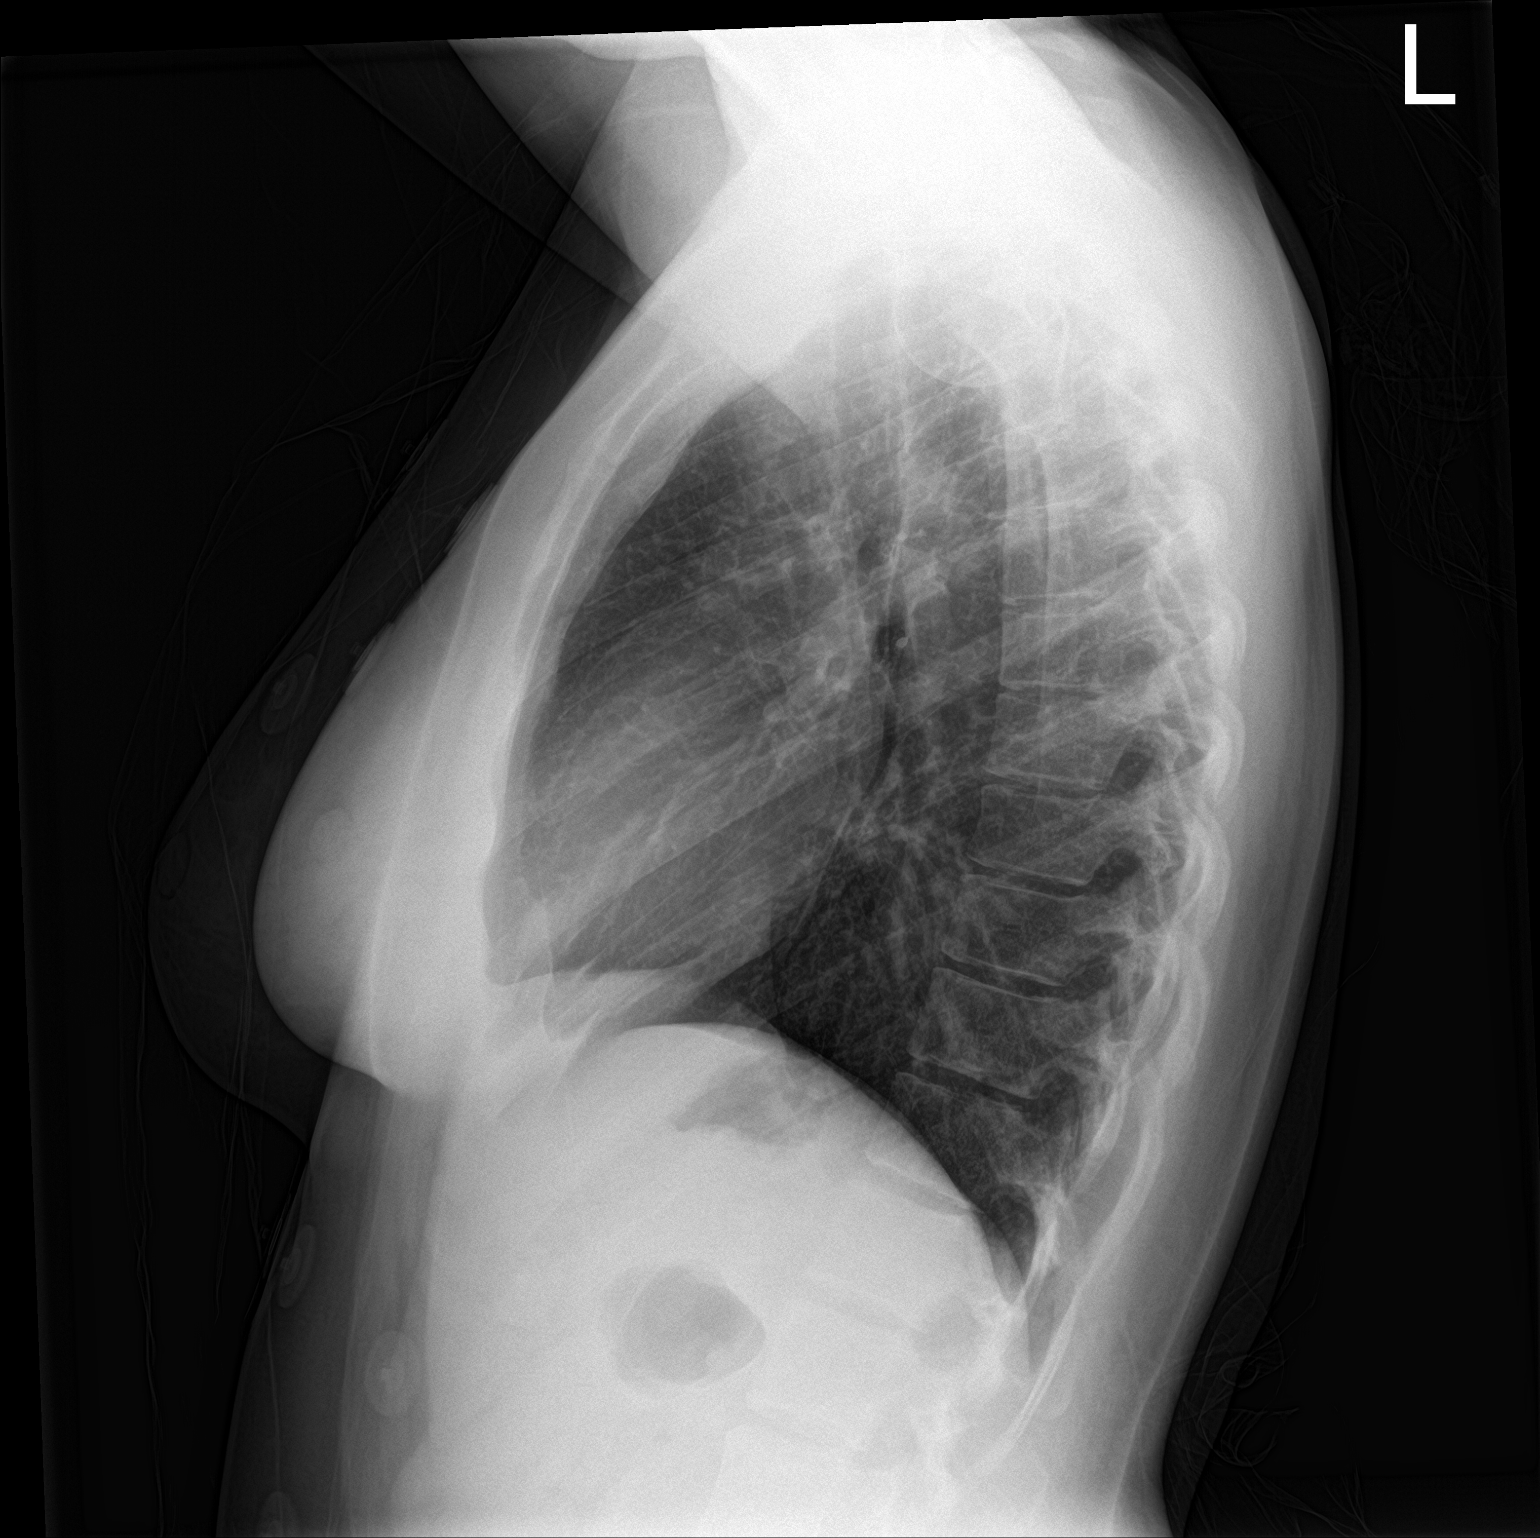

[2 of 2 positions shown; findings below may reference images not displayed]

FINDINGS: The heart size and mediastinal contours are within normal limits.
Both lungs are clear. The visualized skeletal structures are
unremarkable.
IMPRESSION: No active cardiopulmonary disease.

## 2022-03-30 ENCOUNTER — Other Ambulatory Visit: Payer: Self-pay

## 2022-03-30 ENCOUNTER — Observation Stay (HOSPITAL_COMMUNITY)
Admission: EM | Admit: 2022-03-30 | Discharge: 2022-04-01 | Disposition: A | Discharge status: Home / Self Care | Payer: BC Managed Care – PPO | Attending: General Surgery | Admitting: General Surgery

## 2022-03-30 ENCOUNTER — Encounter (HOSPITAL_COMMUNITY): Payer: Self-pay

## 2022-03-30 ENCOUNTER — Emergency Department (HOSPITAL_COMMUNITY): Payer: BC Managed Care – PPO

## 2022-03-30 DIAGNOSIS — K8000 Calculus of gallbladder with acute cholecystitis without obstruction: Principal | ICD-10-CM | POA: Diagnosis present

## 2022-03-30 DIAGNOSIS — K8050 Calculus of bile duct without cholangitis or cholecystitis without obstruction: Secondary | ICD-10-CM | POA: Diagnosis not present

## 2022-03-30 DIAGNOSIS — R1011 Right upper quadrant pain: Secondary | ICD-10-CM | POA: Diagnosis present

## 2022-03-30 DIAGNOSIS — K810 Acute cholecystitis: Secondary | ICD-10-CM

## 2022-03-30 DIAGNOSIS — Z79899 Other long term (current) drug therapy: Secondary | ICD-10-CM | POA: Diagnosis not present

## 2022-03-30 HISTORY — DX: Personal history of urinary calculi: Z87.442

## 2022-03-30 HISTORY — DX: Depression, unspecified: F32.A

## 2022-03-30 HISTORY — DX: Gastro-esophageal reflux disease without esophagitis: K21.9

## 2022-03-30 HISTORY — DX: Dyspnea, unspecified: R06.00

## 2022-03-30 HISTORY — DX: Other specified postprocedural states: Z98.890

## 2022-03-30 HISTORY — DX: Anxiety disorder, unspecified: F41.9

## 2022-03-30 HISTORY — DX: Nausea with vomiting, unspecified: R11.2

## 2022-03-30 LAB — URINALYSIS, ROUTINE W REFLEX MICROSCOPIC
Bacteria, UA: NONE SEEN
Bilirubin Urine: NEGATIVE
Glucose, UA: NEGATIVE mg/dL
Hgb urine dipstick: NEGATIVE
Ketones, ur: NEGATIVE mg/dL
Nitrite: NEGATIVE
Protein, ur: NEGATIVE mg/dL
Specific Gravity, Urine: 1.006 (ref 1.005–1.030)
pH: 7 (ref 5.0–8.0)

## 2022-03-30 LAB — CBC
HCT: 40.2 % (ref 36.0–46.0)
Hemoglobin: 13.5 g/dL (ref 12.0–15.0)
MCH: 29.7 pg (ref 26.0–34.0)
MCHC: 33.6 g/dL (ref 30.0–36.0)
MCV: 88.4 fL (ref 80.0–100.0)
Platelets: 300 10*3/uL (ref 150–400)
RBC: 4.55 MIL/uL (ref 3.87–5.11)
RDW: 12.1 % (ref 11.5–15.5)
WBC: 9.9 10*3/uL (ref 4.0–10.5)
nRBC: 0 % (ref 0.0–0.2)

## 2022-03-30 LAB — COMPREHENSIVE METABOLIC PANEL
ALT: 26 U/L (ref 0–44)
AST: 22 U/L (ref 15–41)
Albumin: 4.2 g/dL (ref 3.5–5.0)
Alkaline Phosphatase: 43 U/L (ref 38–126)
Anion gap: 7 (ref 5–15)
BUN: 11 mg/dL (ref 6–20)
CO2: 27 mmol/L (ref 22–32)
Calcium: 9 mg/dL (ref 8.9–10.3)
Chloride: 104 mmol/L (ref 98–111)
Creatinine, Ser: 0.76 mg/dL (ref 0.44–1.00)
GFR, Estimated: >60 mL/min (ref >60)
Glucose, Bld: 85 mg/dL (ref 70–99)
Potassium: 3.5 mmol/L (ref 3.5–5.1)
Sodium: 138 mmol/L (ref 135–145)
Total Bilirubin: 0.4 mg/dL (ref 0.3–1.2)
Total Protein: 6.8 g/dL (ref 6.5–8.1)

## 2022-03-30 LAB — POC URINE PREG, ED: Preg Test, Ur: NEGATIVE

## 2022-03-30 LAB — LIPASE, BLOOD: Lipase: 37 U/L (ref 11–51)

## 2022-03-30 MED ORDER — ACETAMINOPHEN 650 MG RE SUPP: 650.0000 mg | Freq: Four times a day (QID) | RECTAL | Status: DC | PRN

## 2022-03-30 MED ORDER — ACETAMINOPHEN 325 MG PO TABS
650.0000 mg | ORAL_TABLET | Freq: Four times a day (QID) | ORAL | Status: DC | PRN
  Administered 2022-03-31 (×3): 650 mg via ORAL
  Filled 2022-03-30 (×3): qty 2

## 2022-03-30 MED ORDER — SODIUM CHLORIDE 0.9 % IV SOLN: INTRAVENOUS | Status: DC

## 2022-03-30 MED ORDER — ONDANSETRON HCL 4 MG/2ML IJ SOLN
4.0000 mg | Freq: Once | INTRAMUSCULAR | Status: AC
  Administered 2022-03-30: 4 mg via INTRAVENOUS
  Filled 2022-03-30: qty 2

## 2022-03-30 MED ORDER — METRONIDAZOLE 500 MG/100ML IV SOLN
500.0000 mg | Freq: Two times a day (BID) | INTRAVENOUS | Status: DC
  Administered 2022-03-30 – 2022-04-01 (×4): 500 mg via INTRAVENOUS
  Filled 2022-03-30 (×5): qty 100

## 2022-03-30 MED ORDER — IOHEXOL 300 MG/ML  SOLN
100.0000 mL | Freq: Once | INTRAMUSCULAR | Status: AC | PRN
  Administered 2022-03-30: 100 mL via INTRAVENOUS

## 2022-03-30 MED ORDER — MORPHINE SULFATE (PF) 2 MG/ML IV SOLN
2.0000 mg | INTRAVENOUS | Status: DC | PRN
  Administered 2022-03-31 – 2022-04-01 (×3): 2 mg via INTRAVENOUS
  Filled 2022-03-30 (×4): qty 1

## 2022-03-30 MED ORDER — ONDANSETRON HCL 4 MG PO TABS: 4.0000 mg | ORAL_TABLET | Freq: Four times a day (QID) | ORAL | Status: DC | PRN

## 2022-03-30 MED ORDER — CIPROFLOXACIN IN D5W 400 MG/200ML IV SOLN: 400.0000 mg | Freq: Two times a day (BID) | INTRAVENOUS | Status: DC

## 2022-03-30 MED ORDER — FENTANYL CITRATE PF 50 MCG/ML IJ SOSY
50.0000 ug | PREFILLED_SYRINGE | Freq: Once | INTRAMUSCULAR | Status: AC
  Administered 2022-03-30: 50 ug via INTRAVENOUS
  Filled 2022-03-30: qty 1

## 2022-03-30 MED ORDER — MORPHINE SULFATE (PF) 4 MG/ML IV SOLN
4.0000 mg | Freq: Once | INTRAVENOUS | Status: AC
  Administered 2022-03-30: 4 mg via INTRAVENOUS
  Filled 2022-03-30: qty 1

## 2022-03-30 MED ORDER — CIPROFLOXACIN IN D5W 400 MG/200ML IV SOLN
400.0000 mg | Freq: Two times a day (BID) | INTRAVENOUS | Status: DC
  Administered 2022-03-31 – 2022-04-01 (×3): 400 mg via INTRAVENOUS
  Filled 2022-03-30 (×4): qty 200

## 2022-03-30 MED ORDER — CIPROFLOXACIN IN D5W 400 MG/200ML IV SOLN
400.0000 mg | Freq: Once | INTRAVENOUS | Status: AC
  Administered 2022-03-30: 400 mg via INTRAVENOUS
  Filled 2022-03-30: qty 200

## 2022-03-30 MED ORDER — ONDANSETRON HCL 4 MG/2ML IJ SOLN
4.0000 mg | Freq: Four times a day (QID) | INTRAMUSCULAR | Status: DC | PRN
  Administered 2022-03-31 (×3): 4 mg via INTRAVENOUS
  Filled 2022-03-30 (×3): qty 2

## 2022-03-30 NOTE — Assessment & Plan Note (Signed)
-   Right upper quadrant and epigastric abdominal pain - CT scan shows gallbladder wall thickening that is consistent with acute cholecystitis but recommends right upper quadrant ultrasound for further evaluation - Right upper quadrant ultrasound ordered - Pain control with pain scale - N.p.o. - General surgery consulted and advises they will see patient in the a.m. - Continue to monitor

## 2022-03-30 NOTE — ED Notes (Signed)
Patient transported to CT 

## 2022-03-30 NOTE — ED Triage Notes (Signed)
Pt presents with RUQ abd pain with radiation to the back that started last Friday and has continued to get worse. Pt was seen at [facility] and sent to ED for imaging. Pt reports associated N/V and intermittent constipation and diarrhea.

## 2022-03-30 NOTE — H&P (Signed)
History and Physical    Patient: Alice Ward [id number] DOB: [birth year] DOA: 03/30/2022 DOS: the patient was seen and examined on 03/30/2022 PCP: Claretta Fraise, MD  Patient coming from: Home  Chief Complaint:  Chief Complaint  Patient presents with   Abdominal Pain   HPI: Alice Ward is a 27 y.o. female with no known medical history presents the ED with a chief complaint of abdominal pain.  Patient reports abdominal pain started 5 days ago.  It has been her epigastric region, right upper quadrant, radiating through to her back, and up to her right breast.  The pain is constant.  It is worse with p.o. intake.  She denies nausea.  She was having intermittent diarrhea and constipation but she is back to normal today.  Her pain is no better or worse with bowel movements.  She tried Tylenol and ibuprofen at home without relief.  She has not had any fever.  Patient has never had abdominal surgery.  Patient describes the pain as sharp.  It is worse with palpation.  She has no other complaints at this time.  Patient does not smoke.  She only drinks around holidays.  She does not use illicit drugs.  She is not vaccinated for COVID or flu.  Patient is full code. Review of Systems: As mentioned in the history of present illness. All other systems reviewed and are negative. Past Medical History:  Diagnosis Date   Neck pain    Past Surgical History:  Procedure Laterality Date   WISDOM TOOTH EXTRACTION     Social History:  reports that she has never smoked. She has never used smokeless tobacco. She reports current alcohol use. She reports that she does not use drugs.  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 Rash / blisters around lips.   Clindamycin Hives and Itching   Hydrocodone Itching   Oxycodone Hives and Rash   Amoxicillin Hives   Other Itching    Itch inside throat and around mouth 2 different times  she had cashew which is a tree nut   Penicillins Hives    Family History  Problem Relation Age of Onset   Cancer Mother    Allergies Mother    Allergies Other        both sets of grandparents   Heart disease Other        mother's side-grandparents   Cancer Other        both sets of grandparents (bone,lung,pancreas,skin)    Prior to Admission medications   Medication Sig Start Date End Date Taking? Authorizing Provider  guaiFENesin-codeine (CHERATUSSIN AC) 100-10 MG/5ML syrup Take 5 mLs by mouth every 4 (four) hours as needed for cough. 11/11/14   Claretta Fraise, MD  levofloxacin (LEVAQUIN) 500 MG tablet Take 1 tablet (500 mg total) by mouth daily. 11/11/14   Claretta Fraise, MD  promethazine (PHENERGAN) 25 MG tablet Take 1 tablet (25 mg total) by mouth every 8 (eight) hours as needed for nausea or vomiting. 08/26/15   Claretta Fraise, MD    Physical Exam: Vitals:   03/30/22 1822 03/30/22 1829 03/30/22 2212  BP:  120/79 110/68  Pulse:  88 96  Resp:  18 18  Temp:  98.5 F (36.9 C) 98.3 F (36.8 C)  TempSrc:  Oral   SpO2:  100% 100%  Weight: 67.1 kg  67.3 kg  Height: 5\' 1"  (1.549 m)  1.  General: Patient lying supine in bed,  no acute distress   2. Psychiatric: Alert and oriented x 3, mood and behavior normal for situation, pleasant and cooperative with exam   3. Neurologic: Speech and language are normal, face is symmetric, moves all 4 extremities voluntarily, at baseline without acute deficits on limited exam   4. HEENMT:  Head is atraumatic, normocephalic, pupils reactive to light, neck is supple, trachea is midline, mucous membranes are moist   5. Respiratory : Lungs are clear to auscultation bilaterally without wheezing, rhonchi, rales, no cyanosis, no increase in work of breathing or accessory muscle use   6. Cardiovascular : Heart rate normal, rhythm is regular, no murmurs, rubs or gallops, no peripheral edema, peripheral pulses palpated   7. Gastrointestinal:   Abdomen is soft, no guarding, slightly distended, tenderness to palpation of right upper quadrant, bowel sounds active, no masses or organomegaly palpated   8. Skin:  Skin is warm, dry and intact without rashes, acute lesions, or ulcers on limited exam   9.Musculoskeletal:  No acute deformities or trauma, no asymmetry in tone, no peripheral edema, peripheral pulses palpated, no tenderness to palpation in the extremities  Data Reviewed: In the ED Temp 98.5, heart rate 88, blood pressure 120/79, satting at 100% on room air No leukocytosis Hemoglobin 13.5 History is unremarkable CT abdomen pelvis shows gallbladder distention with mild wall thickening its consistent with acute cholecystitis but recommends right upper quadrant ultrasound Patient was started on Cipro in the ED General surgery consulted and advises admission to any kind of hospitalist service  Assessment and Plan: * Acute cholecystitis - Right upper quadrant and epigastric abdominal pain - CT scan shows gallbladder wall thickening that is consistent with acute cholecystitis but recommends right upper quadrant ultrasound for further evaluation - Right upper quadrant ultrasound ordered - Pain control with pain scale - N.p.o. - General surgery consulted and advises they will see patient in the a.m. -Cipro started in the ED due to penicillin allergy - Continue Cipro and add Flagyl - Continue to monitor      Advance Care Planning:   Code Status: Full Code  Consults: General surgery  Family Communication: Sister at bedside  Severity of Illness: The appropriate patient status for this patient is OBSERVATION. Observation status is judged to be reasonable and necessary in order to provide the required intensity of service to ensure the patient's safety. The patient's presenting symptoms, physical exam findings, and initial radiographic and laboratory data in the context of their medical condition is felt to place them at  decreased risk for further clinical deterioration. Furthermore, it is anticipated that the patient will be medically stable for discharge from the hospital within 2 midnights of admission.   Author: Rolla Plate, DO 03/30/2022 11:04 PM  For on call review [email or web address].

## 2022-03-30 NOTE — ED Provider Notes (Signed)
[place] Provider Note   CSN: [id number] Arrival date & time: 03/30/22  1802     History  Chief Complaint  Patient presents with   Abdominal Pain    Alice Ward is a 27 y.o. female.  The history is provided by the patient.  Abdominal Pain Pain location:  RUQ Pain quality: aching, bloating, burning, fullness and pressure   Pain radiates to:  R shoulder, R flank and epigastric region Pain severity:  Moderate (mod-sev) Duration:  5 days Timing:  Constant Progression:  Waxing and waning Chronicity:  New Context: not alcohol use   Relieved by:  Nothing Worsened by:  Nothing Ineffective treatments:  Acetaminophen, NSAIDs, not moving and position changes Associated symptoms: vomiting   Associated symptoms: no diarrhea        Home Medications Prior to Admission medications   Medication Sig Start Date End Date Taking? Authorizing Provider  guaiFENesin-codeine (CHERATUSSIN AC) 100-10 MG/5ML syrup Take 5 mLs by mouth every 4 (four) hours as needed for cough. 11/11/14   Claretta Fraise, MD  levofloxacin (LEVAQUIN) 500 MG tablet Take 1 tablet (500 mg total) by mouth daily. 11/11/14   Claretta Fraise, MD  promethazine (PHENERGAN) 25 MG tablet Take 1 tablet (25 mg total) by mouth every 8 (eight) hours as needed for nausea or vomiting. 08/26/15   Claretta Fraise, MD      Allergies    Cashew nut (anacardium occidentale) skin test, Clindamycin, Hydrocodone, Oxycodone, Amoxicillin, Other, and Penicillins    Review of Systems   Review of Systems  Gastrointestinal:  Positive for abdominal pain and vomiting. Negative for diarrhea.    Physical Exam Updated Vital Signs BP 120/79 (BP Location: Right Arm)   Pulse 88   Temp 98.5 F (36.9 C) (Oral)   Resp 18   Ht 5\' 1"  (1.549 m)   Wt 67.1 kg   LMP 03/07/2022   SpO2 100%   BMI 27.96 kg/m  Physical Exam Vitals and nursing note reviewed.  Constitutional:      General: She is not in acute  distress.    Appearance: She is well-developed. She is not diaphoretic.  HENT:     Head: Normocephalic and atraumatic.     Right Ear: External ear normal.     Left Ear: External ear normal.     Nose: Nose normal.     Mouth/Throat:     Mouth: Mucous membranes are moist.  Eyes:     General: No scleral icterus.    Conjunctiva/sclera: Conjunctivae normal.  Cardiovascular:     Rate and Rhythm: Normal rate and regular rhythm.     Heart sounds: Normal heart sounds. No murmur heard.    No friction rub. No gallop.  Pulmonary:     Effort: Pulmonary effort is normal. No respiratory distress.     Breath sounds: Normal breath sounds.  Abdominal:     General: Bowel sounds are normal. There is no distension.     Palpations: Abdomen is soft. There is no mass.     Tenderness: There is abdominal tenderness in the right upper quadrant. There is guarding. Positive signs include Murphy's sign.  Musculoskeletal:     Cervical back: Normal range of motion.  Skin:    General: Skin is warm and dry.  Neurological:     Mental Status: She is alert and oriented to person, place, and time.  Psychiatric:        Behavior: Behavior normal.     ED  Results / Procedures / Treatments   Labs (all labs ordered are listed, but only abnormal results are displayed) Labs Reviewed  LIPASE, BLOOD  COMPREHENSIVE METABOLIC PANEL  CBC  URINALYSIS, ROUTINE W REFLEX MICROSCOPIC  POC URINE PREG, ED    EKG None  Radiology No results found.  Procedures Procedures    Medications Ordered in ED Medications - No data to display  ED Course/ Medical Decision Making/ A&P                             Medical Decision Making This patient presents to the ED for concern of ruq painx 5 days, this involves an extensive number of treatment options, and is a complaint that carries with it a high risk of complications and morbidity.  The differential diagnosis for RUQ is, but not limited to:  Cholelithiasis, cholecystitis,  cholangitis, choledocholithiasis, hepatitis, pancreatitis, RLL pneumonia, pyelonephritis, urinary calculi, abdominal/liver abscess, musculoskeletal pain, herpes zoster.    After review of all data points patient appears to have biliary colic and cholelithiasis, no evidence of acute cholecystitis. Although patient initially denied history of similar symptoms after discussion of symptoms of biliary colic patient states that she feels that she is likely been having this for far longer and thinks that she began having it about 9 months after delivering her child 6 years ago.  She has had multiple episodes of similar symptoms not have lasted this long.  Co morbidities that complicate the patient evaluation       None     Additional history obtained:  {Additional history obtained from family member at bedside    Lab Tests:  I Ordered, and personally interpreted labs.  The pertinent results include: CBC, CMP, lipase, urine, pregnancy test all negative and without significant abnormality   Imaging Studies ordered:  I ordered imaging studies including CT abdomen pelvis with contrast I independently visualized and interpreted imaging which showed gallbladder wall thickening and cholelithiasis I agree with the radiologist interpretation   Cardiac Monitoring/ECG:        Medicines ordered and prescription drug management:  I ordered medication including Medications metroNIDAZOLE (FLAGYL) IVPB 500 mg (500 mg Intravenous New Bag/Given 03/31/22 0802) morphine (PF) 2 MG/ML injection 2 mg (has no administration in time range) ciprofloxacin (CIPRO) IVPB 400 mg (has no administration in time range) 0.9 %  sodium chloride infusion ( Intravenous Infusion Verify 03/31/22 0721) acetaminophen (TYLENOL) tablet 650 mg (650 mg Oral Given 03/31/22 0524)   Or acetaminophen (TYLENOL) suppository 650 mg ( Rectal See Alternative 03/31/22 0524) ondansetron (ZOFRAN) tablet 4 mg (has no administration in  time range)   Or ondansetron (ZOFRAN) injection 4 mg (has no administration in time range) escitalopram (LEXAPRO) tablet 10 mg (has no administration in time range) escitalopram (LEXAPRO) tablet 5 mg (has no administration in time range) fentaNYL (SUBLIMAZE) injection 50 mcg (50 mcg Intravenous Given 03/30/22 1936) ondansetron (ZOFRAN) injection 4 mg (4 mg Intravenous Given 03/30/22 1937) iohexol (OMNIPAQUE) 300 MG/ML solution 100 mL (100 mLs Intravenous Contrast Given 03/30/22 2038) ciprofloxacin (CIPRO) IVPB 400 mg (0 mg Intravenous Stopped 03/30/22 2201) morphine (PF) 4 MG/ML injection 4 mg (4 mg Intravenous Given 03/30/22 2133) ondansetron (ZOFRAN) injection 4 mg (4 mg Intravenous Given 03/30/22 [id number]) for biliary colic, gallbladder wall thickening Reevaluation of the patient after these medicines showed that the patient stayed the same I have reviewed the patients home medicines and have made adjustments as needed   Test  Considered:       Right upper quadrant ultrasound however unavailable at this time   Critical Interventions:       Fluids, antibiotics, pain management   Consultations Obtained:  I requested consultation with general surgery, Dr. Arnoldo Morale.  He recommends admission.  Discussed case with Dr.Z.G.  Who will admit the patient overnight    Problem List / ED Course:       ([id number]) Biliary colic  (primary encounter diagnosis)     Reevaluation:  After the interventions noted above, I reevaluated the patient and found that they have :stayed the same   Social Determinants of Health:      Young child at home, 77 years old, good familial support   Dispostion:  After consideration of the diagnostic results and the patients response to treatment, I feel that the patent would benefit from admission.    Amount and/or Complexity of Data Reviewed Labs: ordered. Radiology: ordered.  Risk Prescription drug management. Decision regarding  hospitalization.           Final Clinical Impression(s) / ED Diagnoses Final diagnoses:  None    Rx / DC Orders ED Discharge Orders     None         Margarita Mail, PA-C 03/31/22 1010    Milton Ferguson, MD 04/01/22 1153

## 2022-03-31 ENCOUNTER — Observation Stay (HOSPITAL_COMMUNITY): Payer: BC Managed Care – PPO

## 2022-03-31 DIAGNOSIS — K8000 Calculus of gallbladder with acute cholecystitis without obstruction: Secondary | ICD-10-CM

## 2022-03-31 LAB — CBC WITH DIFFERENTIAL/PLATELET
Abs Immature Granulocytes: 0.01 10*3/uL (ref 0.00–0.07)
Basophils Absolute: 0 10*3/uL (ref 0.0–0.1)
Basophils Relative: 1 %
Eosinophils Absolute: 0.1 10*3/uL (ref 0.0–0.5)
Eosinophils Relative: 1 %
HCT: 41.8 % (ref 36.0–46.0)
Hemoglobin: 13.9 g/dL (ref 12.0–15.0)
Immature Granulocytes: 0 %
Lymphocytes Relative: 32 %
Lymphs Abs: 2.1 10*3/uL (ref 0.7–4.0)
MCH: 29.8 pg (ref 26.0–34.0)
MCHC: 33.3 g/dL (ref 30.0–36.0)
MCV: 89.5 fL (ref 80.0–100.0)
Monocytes Absolute: 0.6 10*3/uL (ref 0.1–1.0)
Monocytes Relative: 9 %
Neutro Abs: 3.9 10*3/uL (ref 1.7–7.7)
Neutrophils Relative %: 57 %
Platelets: 284 10*3/uL (ref 150–400)
RBC: 4.67 MIL/uL (ref 3.87–5.11)
RDW: 12 % (ref 11.5–15.5)
WBC: 6.8 10*3/uL (ref 4.0–10.5)
nRBC: 0 % (ref 0.0–0.2)

## 2022-03-31 LAB — COMPREHENSIVE METABOLIC PANEL
ALT: 138 U/L — ABNORMAL HIGH (ref 0–44)
AST: 189 U/L — ABNORMAL HIGH (ref 15–41)
Albumin: 4 g/dL (ref 3.5–5.0)
Alkaline Phosphatase: 47 U/L (ref 38–126)
Anion gap: 6 (ref 5–15)
BUN: 8 mg/dL (ref 6–20)
CO2: 27 mmol/L (ref 22–32)
Calcium: 9 mg/dL (ref 8.9–10.3)
Chloride: 104 mmol/L (ref 98–111)
Creatinine, Ser: 0.77 mg/dL (ref 0.44–1.00)
GFR, Estimated: >60 mL/min (ref >60)
Glucose, Bld: 91 mg/dL (ref 70–99)
Potassium: 3.8 mmol/L (ref 3.5–5.1)
Sodium: 137 mmol/L (ref 135–145)
Total Bilirubin: 0.5 mg/dL (ref 0.3–1.2)
Total Protein: 6.5 g/dL (ref 6.5–8.1)

## 2022-03-31 LAB — MAGNESIUM: Magnesium: 1.9 mg/dL (ref 1.7–2.4)

## 2022-03-31 LAB — HIV ANTIBODY (ROUTINE TESTING W REFLEX): HIV Screen 4th Generation wRfx: NONREACTIVE

## 2022-03-31 MED ORDER — METOPROLOL TARTRATE 25 MG PO TABS
12.5000 mg | ORAL_TABLET | Freq: Two times a day (BID) | ORAL | Status: DC
  Administered 2022-03-31 – 2022-04-01 (×2): 12.5 mg via ORAL
  Filled 2022-03-31 (×2): qty 1

## 2022-03-31 MED ORDER — ESCITALOPRAM OXALATE 10 MG PO TABS
5.0000 mg | ORAL_TABLET | Freq: Every evening | ORAL | Status: DC
  Administered 2022-03-31: 5 mg via ORAL
  Filled 2022-03-31: qty 1

## 2022-03-31 MED ORDER — ESCITALOPRAM OXALATE 10 MG PO TABS
10.0000 mg | ORAL_TABLET | Freq: Every day | ORAL | Status: DC
  Administered 2022-04-01: 10 mg via ORAL
  Filled 2022-03-31: qty 1

## 2022-03-31 MED ORDER — CHLORHEXIDINE GLUCONATE CLOTH 2 % EX PADS
6.0000 | MEDICATED_PAD | Freq: Once | CUTANEOUS | Status: AC
  Administered 2022-03-31: 6 via TOPICAL

## 2022-03-31 MED ORDER — ESCITALOPRAM OXALATE 10 MG PO TABS: 10.0000 mg | ORAL_TABLET | Freq: Two times a day (BID) | ORAL | Status: DC

## 2022-03-31 NOTE — H&P (View-Only) (Signed)
Reason for Consult: Right upper quadrant abdominal pain, cholecystitis Referring Physician: Dr. Linnell Fulling Alice Ward is an 27 y.o. female.  HPI: Patient is a 27 year old white female who presented to the emergency room with a less than 24-hour history of worsening right upper quadrant abdominal pain and occasional nausea.  The right upper quadrant abdominal pain radiates around her right flank to her right shoulder.  She tried Tylenol and ibuprofen at home but this did not relieve her symptoms.  She denies any fever, chills, or jaundice.  She presented to the emergency room and CT scan of the abdomen showed gallbladder wall thickening.  She was admitted to the hospital for IV antibiotics and control of her pain.  She subsequently underwent ultrasound the gallbladder this morning which reveals cholelithiasis with normal common bile duct.  Liver enzyme tests have increased since last night when they were within normal limits.  Patient states she may have had a similar episode approximately 3 years ago.  Past Medical History:  Diagnosis Date   Neck pain     Past Surgical History:  Procedure Laterality Date   WISDOM TOOTH EXTRACTION      Family History  Problem Relation Age of Onset   Cancer Mother    Allergies Mother    Allergies Other        both sets of grandparents   Heart disease Other        mother's side-grandparents   Cancer Other        both sets of grandparents (bone,lung,pancreas,skin)    Social History:  reports that she has never smoked. She has never used smokeless tobacco. She reports current alcohol use. She reports that she does not use drugs.  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 Rash / blisters around lips.   Clindamycin Hives and Itching   Hydrocodone Itching   Oxycodone Hives and Rash   Amoxicillin Hives   Other Itching    Itch inside throat and around mouth  2 different times she had cashew which is a tree nut   Penicillins Hives    Medications: I have reviewed the patient's current medications.  Results for orders placed or performed during the hospital encounter of 03/30/22 (from the past 48 hour(s))  Lipase, blood     Status: None   Collection Time: 03/30/22  6:39 PM  Result Value Ref Range   Lipase 37 11 - 51 U/L    Comment: Performed at [facility], [address] [place], [place] [address]  Comprehensive metabolic panel     Status: None   Collection Time: 03/30/22  6:39 PM  Result Value Ref Range   Sodium 138 135 - 145 mmol/L   Potassium 3.5 3.5 - 5.1 mmol/L   Chloride 104 98 - 111 mmol/L   CO2 27 22 - 32 mmol/L   Glucose, Bld 85 70 - 99 mg/dL    Comment: Glucose reference range applies only to samples taken after fasting for at least 8 hours.   BUN 11 6 - 20 mg/dL   Creatinine, Ser 0.76 0.44 - 1.00 mg/dL   Calcium 9.0 8.9 - 10.3 mg/dL   Total Protein 6.8 6.5 - 8.1 g/dL   Albumin 4.2 3.5 - 5.0 g/dL   AST 22 15 - 41 U/L   ALT 26 0 - 44 U/L   Alkaline Phosphatase 43 38 - 126 U/L   Total Bilirubin  0.4 0.3 - 1.2 mg/dL   GFR, Estimated >60 >60 mL/min    Comment: (NOTE) Calculated using the CKD-EPI Creatinine Equation (2021)    Anion gap 7 5 - 15    Comment: Performed at [facility], [address], [place], [place] [address]  CBC     Status: None   Collection Time: 03/30/22  6:39 PM  Result Value Ref Range   WBC 9.9 4.0 - 10.5 K/uL   RBC 4.55 3.87 - 5.11 MIL/uL   Hemoglobin 13.5 12.0 - 15.0 g/dL   HCT 40.2 36.0 - 46.0 %   MCV 88.4 80.0 - 100.0 fL   MCH 29.7 26.0 - 34.0 pg   MCHC 33.6 30.0 - 36.0 g/dL   RDW 12.1 11.5 - 15.5 %   Platelets 300 150 - 400 K/uL   nRBC 0.0 0.0 - 0.2 %    Comment: Performed at [facility], [address] [place], [place] [address]  Urinalysis, Routine w reflex microscopic -Urine, Clean Catch     Status: Abnormal   Collection Time: 03/30/22  7:20 PM  Result Value Ref Range   Color, Urine  STRAW (A) YELLOW   APPearance CLEAR CLEAR   Specific Gravity, Urine 1.006 1.005 - 1.030   pH 7.0 5.0 - 8.0   Glucose, UA NEGATIVE NEGATIVE mg/dL   Hgb urine dipstick NEGATIVE NEGATIVE   Bilirubin Urine NEGATIVE NEGATIVE   Ketones, ur NEGATIVE NEGATIVE mg/dL   Protein, ur NEGATIVE NEGATIVE mg/dL   Nitrite NEGATIVE NEGATIVE   Leukocytes,Ua TRACE (A) NEGATIVE   RBC / HPF 0-5 0 - 5 RBC/hpf   WBC, UA 0-5 0 - 5 WBC/hpf   Bacteria, UA NONE SEEN NONE SEEN   Squamous Epithelial / HPF 0-5 0 - 5 /HPF    Comment: Performed at [facility], [address] [place], [place] [address]  POC urine preg, ED     Status: None   Collection Time: 03/30/22  7:40 PM  Result Value Ref Range   Preg Test, Ur Negative Negative  Comprehensive metabolic panel     Status: Abnormal   Collection Time: 03/31/22  4:29 AM  Result Value Ref Range   Sodium 137 135 - 145 mmol/L   Potassium 3.8 3.5 - 5.1 mmol/L   Chloride 104 98 - 111 mmol/L   CO2 27 22 - 32 mmol/L   Glucose, Bld 91 70 - 99 mg/dL    Comment: Glucose reference range applies only to samples taken after fasting for at least 8 hours.   BUN 8 6 - 20 mg/dL   Creatinine, Ser 0.77 0.44 - 1.00 mg/dL   Calcium 9.0 8.9 - 10.3 mg/dL   Total Protein 6.5 6.5 - 8.1 g/dL   Albumin 4.0 3.5 - 5.0 g/dL   AST 189 (H) 15 - 41 U/L   ALT 138 (H) 0 - 44 U/L   Alkaline Phosphatase 47 38 - 126 U/L   Total Bilirubin 0.5 0.3 - 1.2 mg/dL   GFR, Estimated >60 >60 mL/min    Comment: (NOTE) Calculated using the CKD-EPI Creatinine Equation (2021)    Anion gap 6 5 - 15    Comment: Performed at [facility], [address], [place], [place] [address]  Magnesium     Status: None   Collection Time: 03/31/22  4:29 AM  Result Value Ref Range   Magnesium 1.9 1.7 - 2.4 mg/dL    Comment: Performed at [facility], [address], [place],  [address]  CBC  with Differential/Platelet     Status: None   Collection Time: 03/31/22  4:29 AM  Result Value Ref Range   WBC 6.8 4.0 -  10.5 K/uL   RBC 4.67 3.87 - 5.11 MIL/uL   Hemoglobin 13.9 12.0 - 15.0 g/dL   HCT 41.8 36.0 - 46.0 %   MCV 89.5 80.0 - 100.0 fL   MCH 29.8 26.0 - 34.0 pg   MCHC 33.3 30.0 - 36.0 g/dL   RDW 12.0 11.5 - 15.5 %   Platelets 284 150 - 400 K/uL   nRBC 0.0 0.0 - 0.2 %   Neutrophils Relative % 57 %   Neutro Abs 3.9 1.7 - 7.7 K/uL   Lymphocytes Relative 32 %   Lymphs Abs 2.1 0.7 - 4.0 K/uL   Monocytes Relative 9 %   Monocytes Absolute 0.6 0.1 - 1.0 K/uL   Eosinophils Relative 1 %   Eosinophils Absolute 0.1 0.0 - 0.5 K/uL   Basophils Relative 1 %   Basophils Absolute 0.0 0.0 - 0.1 K/uL   Immature Granulocytes 0 %   Abs Immature Granulocytes 0.01 0.00 - 0.07 K/uL    Comment: Performed at [facility], [address], [place], [place] [address]    US Abdomen Limited RUQ (LIVER/GB)  Result Date: 03/31/2022 CLINICAL DATA:  Upper abdominal pain with nausea. EXAM: ULTRASOUND ABDOMEN LIMITED RIGHT UPPER QUADRANT COMPARISON:  CT scan from previous day FINDINGS: Gallbladder: Numerous tiny 2-3 mm gallstones evident. Gallbladder wall thickness upper normal at 3 mm. No substantial pericholecystic fluid. Sonographer reports no sonographic Murphy sign. Common bile duct: Diameter: 3 mm Liver: No focal lesion identified. Within normal limits in parenchymal echogenicity. Portal vein is patent on color Doppler imaging with normal direction of blood flow towards the liver. Other: None. IMPRESSION: Cholelithiasis with borderline gallbladder wall thickening. Acute cholecystitis not excluded. If clinical picture is equivocal, nuclear scintigraphy may prove helpful to further evaluate. Electronically Signed   By: Misty Stanley M.D.   On: 03/31/2022 09:13   CT ABDOMEN PELVIS W CONTRAST  Result Date: 03/30/2022 CLINICAL DATA:  Right upper quadrant pain EXAM: CT ABDOMEN AND PELVIS WITH CONTRAST TECHNIQUE: Multidetector CT imaging of the abdomen and pelvis was performed using the standard protocol following bolus  administration of intravenous contrast. RADIATION DOSE REDUCTION: This exam was performed according to the departmental dose-optimization program which includes automated exposure control, adjustment of the mA and/or kV according to patient size and/or use of iterative reconstruction technique. CONTRAST:  177mL OMNIPAQUE IOHEXOL 300 MG/ML  SOLN COMPARISON:  None Available. FINDINGS: Lower chest: No acute abnormality. Hepatobiliary: The gallbladder is distended. There is questionable mild gallbladder wall thickening. No gallstones are seen. No biliary ductal dilatation. The liver is within normal limits. Pancreas: Unremarkable. No pancreatic ductal dilatation or surrounding inflammatory changes. Spleen: Normal in size without focal abnormality. Adrenals/Urinary Tract: Adrenal glands are unremarkable. Kidneys are normal, without renal calculi, focal lesion, or hydronephrosis. Bladder is unremarkable. Stomach/Bowel: Stomach is within normal limits. Appendix appears normal. No evidence of bowel wall thickening, distention, or inflammatory changes. Vascular/Lymphatic: No significant vascular findings are present. No enlarged abdominal or pelvic lymph nodes. Reproductive: There is an IUD in the uterus. There is a collapsing cyst or follicle in the right ovary measuring 16 mm. Left ovary within normal limits. Other: There is a small amount of free fluid in the right adnexa and pelvis. No focal abdominal wall hernia free Musculoskeletal: No acute or significant osseous findings. IMPRESSION: 1. Gallbladder distension with questionable mild  wall thickening. Please correlate clinically for cholecystitis. Consider further evaluation with ultrasound. 2. Small amount of free fluid in the right adnexa and pelvis. 3. Collapsing cyst or follicle in the right ovary. Electronically Signed   By: Ronney Asters M.D.   On: 03/30/2022 20:55    ROS:  Pertinent items are noted in HPI.  Blood pressure 110/68, pulse 96, temperature 98.3  F (36.8 C), resp. rate 18, height 5\' 1"  (1.549 m), weight 67.3 kg, last menstrual period 03/07/2022, SpO2 100 %. Physical Exam: Well-developed well-nourished white female no acute distress Head is normocephalic, atraumatic Lungs clear to auscultation with equal breath sounds bilaterally Heart examination reveals regular rate and rhythm without S3, S4, murmurs Abdomen is soft with tenderness noted in the right upper quadrant to palpation.  No hepatosplenomegaly, masses, or hernias identified.  CT scan images personally reviewed  Assessment/Plan: Impression: Acute cholecystitis, cholelithiasis Plan: Patient has been started on IV antibiotics.  Will get control of her pain and nausea.  She was subsequently undergo a robotic assisted laparoscopic cholecystectomy in the morning.  The risks and benefits of the procedure including bleeding, infection, hepatobiliary injury, and the possibility of an open procedure were fully explained to the patient, who gave informed consent. Alice Ward 03/31/2022, 10:15 AM

## 2022-03-31 NOTE — Progress Notes (Signed)
  Transition of Care [facility]) Screening Note   Patient Details  Name: Alice Ward Date of Birth: [birth year]   Transition of Care [facility]) CM/SW Contact:    Iona Beard, [place] Phone Number: 03/31/2022, 8:56 AM    Transition of Care Department [facility]) has reviewed patient and no TOC needs have been identified at this time. We will continue to monitor patient advancement through interdisciplinary progression rounds. If new patient transition needs arise, please place a TOC consult.

## 2022-03-31 NOTE — Consult Note (Signed)
Reason for Consult: Right upper quadrant abdominal pain, cholecystitis Referring Physician: Dr. Linnell Fulling Alice Ward is an 27 y.o. female.  HPI: Patient is a 27 year old white female who presented to the emergency room with a less than 24-hour history of worsening right upper quadrant abdominal pain and occasional nausea.  The right upper quadrant abdominal pain radiates around her right flank to her right shoulder.  She tried Tylenol and ibuprofen at home but this did not relieve her symptoms.  She denies any fever, chills, or jaundice.  She presented to the emergency room and CT scan of the abdomen showed gallbladder wall thickening.  She was admitted to the hospital for IV antibiotics and control of her pain.  She subsequently underwent ultrasound the gallbladder this morning which reveals cholelithiasis with normal common bile duct.  Liver enzyme tests have increased since last night when they were within normal limits.  Patient states she may have had a similar episode approximately 3 years ago.  Past Medical History:  Diagnosis Date   Neck pain     Past Surgical History:  Procedure Laterality Date   WISDOM TOOTH EXTRACTION      Family History  Problem Relation Age of Onset   Cancer Mother    Allergies Mother    Allergies Other        both sets of grandparents   Heart disease Other        mother's side-grandparents   Cancer Other        both sets of grandparents (bone,lung,pancreas,skin)    Social History:  reports that she has never smoked. She has never used smokeless tobacco. She reports current alcohol use. She reports that she does not use drugs.  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 Rash / blisters around lips.   Clindamycin Hives and Itching   Hydrocodone Itching   Oxycodone Hives and Rash   Amoxicillin Hives   Other Itching    Itch inside throat and around mouth  2 different times she had cashew which is a tree nut   Penicillins Hives    Medications: I have reviewed the patient's current medications.  Results for orders placed or performed during the hospital encounter of 03/30/22 (from the past 48 hour(s))  Lipase, blood     Status: None   Collection Time: 03/30/22  6:39 PM  Result Value Ref Range   Lipase 37 11 - 51 U/L    Comment: Performed at [facility], [address], [place], [place] [address]  Comprehensive metabolic panel     Status: None   Collection Time: 03/30/22  6:39 PM  Result Value Ref Range   Sodium 138 135 - 145 mmol/L   Potassium 3.5 3.5 - 5.1 mmol/L   Chloride 104 98 - 111 mmol/L   CO2 27 22 - 32 mmol/L   Glucose, Bld 85 70 - 99 mg/dL    Comment: Glucose reference range applies only to samples taken after fasting for at least 8 hours.   BUN 11 6 - 20 mg/dL   Creatinine, Ser 0.76 0.44 - 1.00 mg/dL   Calcium 9.0 8.9 - 10.3 mg/dL   Total Protein 6.8 6.5 - 8.1 g/dL   Albumin 4.2 3.5 - 5.0 g/dL   AST 22 15 - 41 U/L   ALT 26 0 - 44 U/L   Alkaline Phosphatase 43 38 - 126 U/L   Total Bilirubin  0.4 0.3 - 1.2 mg/dL   GFR, Estimated >60 >60 mL/min    Comment: (NOTE) Calculated using the CKD-EPI Creatinine Equation (2021)    Anion gap 7 5 - 15    Comment: Performed at [facility], [address], [place], [place] [address]  CBC     Status: None   Collection Time: 03/30/22  6:39 PM  Result Value Ref Range   WBC 9.9 4.0 - 10.5 K/uL   RBC 4.55 3.87 - 5.11 MIL/uL   Hemoglobin 13.5 12.0 - 15.0 g/dL   HCT 40.2 36.0 - 46.0 %   MCV 88.4 80.0 - 100.0 fL   MCH 29.7 26.0 - 34.0 pg   MCHC 33.6 30.0 - 36.0 g/dL   RDW 12.1 11.5 - 15.5 %   Platelets 300 150 - 400 K/uL   nRBC 0.0 0.0 - 0.2 %    Comment: Performed at [facility], [address], [place], [place] [address]  Urinalysis, Routine w reflex microscopic -Urine, Clean Catch     Status: Abnormal   Collection Time: 03/30/22  7:20 PM  Result Value Ref Range   Color, Urine  STRAW (A) YELLOW   APPearance CLEAR CLEAR   Specific Gravity, Urine 1.006 1.005 - 1.030   pH 7.0 5.0 - 8.0   Glucose, UA NEGATIVE NEGATIVE mg/dL   Hgb urine dipstick NEGATIVE NEGATIVE   Bilirubin Urine NEGATIVE NEGATIVE   Ketones, ur NEGATIVE NEGATIVE mg/dL   Protein, ur NEGATIVE NEGATIVE mg/dL   Nitrite NEGATIVE NEGATIVE   Leukocytes,Ua TRACE (A) NEGATIVE   RBC / HPF 0-5 0 - 5 RBC/hpf   WBC, UA 0-5 0 - 5 WBC/hpf   Bacteria, UA NONE SEEN NONE SEEN   Squamous Epithelial / HPF 0-5 0 - 5 /HPF    Comment: Performed at [facility], [address] [place], [place] [address]  POC urine preg, ED     Status: None   Collection Time: 03/30/22  7:40 PM  Result Value Ref Range   Preg Test, Ur Negative Negative  Comprehensive metabolic panel     Status: Abnormal   Collection Time: 03/31/22  4:29 AM  Result Value Ref Range   Sodium 137 135 - 145 mmol/L   Potassium 3.8 3.5 - 5.1 mmol/L   Chloride 104 98 - 111 mmol/L   CO2 27 22 - 32 mmol/L   Glucose, Bld 91 70 - 99 mg/dL    Comment: Glucose reference range applies only to samples taken after fasting for at least 8 hours.   BUN 8 6 - 20 mg/dL   Creatinine, Ser 0.77 0.44 - 1.00 mg/dL   Calcium 9.0 8.9 - 10.3 mg/dL   Total Protein 6.5 6.5 - 8.1 g/dL   Albumin 4.0 3.5 - 5.0 g/dL   AST 189 (H) 15 - 41 U/L   ALT 138 (H) 0 - 44 U/L   Alkaline Phosphatase 47 38 - 126 U/L   Total Bilirubin 0.5 0.3 - 1.2 mg/dL   GFR, Estimated >60 >60 mL/min    Comment: (NOTE) Calculated using the CKD-EPI Creatinine Equation (2021)    Anion gap 6 5 - 15    Comment: Performed at [facility], [address], [place], [place] [address]  Magnesium     Status: None   Collection Time: 03/31/22  4:29 AM  Result Value Ref Range   Magnesium 1.9 1.7 - 2.4 mg/dL    Comment: Performed at [facility], [address], [place], [place] [address]  CBC  with Differential/Platelet     Status: None   Collection Time: 03/31/22  4:29 AM  Result Value Ref Range   WBC 6.8 4.0 -  10.5 K/uL   RBC 4.67 3.87 - 5.11 MIL/uL   Hemoglobin 13.9 12.0 - 15.0 g/dL   HCT 41.8 36.0 - 46.0 %   MCV 89.5 80.0 - 100.0 fL   MCH 29.8 26.0 - 34.0 pg   MCHC 33.3 30.0 - 36.0 g/dL   RDW 12.0 11.5 - 15.5 %   Platelets 284 150 - 400 K/uL   nRBC 0.0 0.0 - 0.2 %   Neutrophils Relative % 57 %   Neutro Abs 3.9 1.7 - 7.7 K/uL   Lymphocytes Relative 32 %   Lymphs Abs 2.1 0.7 - 4.0 K/uL   Monocytes Relative 9 %   Monocytes Absolute 0.6 0.1 - 1.0 K/uL   Eosinophils Relative 1 %   Eosinophils Absolute 0.1 0.0 - 0.5 K/uL   Basophils Relative 1 %   Basophils Absolute 0.0 0.0 - 0.1 K/uL   Immature Granulocytes 0 %   Abs Immature Granulocytes 0.01 0.00 - 0.07 K/uL    Comment: Performed at [facility], [address] [place], [place] [address]    US Abdomen Limited RUQ (LIVER/GB)  Result Date: 03/31/2022 CLINICAL DATA:  Upper abdominal pain with nausea. EXAM: ULTRASOUND ABDOMEN LIMITED RIGHT UPPER QUADRANT COMPARISON:  CT scan from previous day FINDINGS: Gallbladder: Numerous tiny 2-3 mm gallstones evident. Gallbladder wall thickness upper normal at 3 mm. No substantial pericholecystic fluid. Sonographer reports no sonographic Murphy sign. Common bile duct: Diameter: 3 mm Liver: No focal lesion identified. Within normal limits in parenchymal echogenicity. Portal vein is patent on color Doppler imaging with normal direction of blood flow towards the liver. Other: None. IMPRESSION: Cholelithiasis with borderline gallbladder wall thickening. Acute cholecystitis not excluded. If clinical picture is equivocal, nuclear scintigraphy may prove helpful to further evaluate. Electronically Signed   By: Misty Stanley M.D.   On: 03/31/2022 09:13   CT ABDOMEN PELVIS W CONTRAST  Result Date: 03/30/2022 CLINICAL DATA:  Right upper quadrant pain EXAM: CT ABDOMEN AND PELVIS WITH CONTRAST TECHNIQUE: Multidetector CT imaging of the abdomen and pelvis was performed using the standard protocol following bolus  administration of intravenous contrast. RADIATION DOSE REDUCTION: This exam was performed according to the departmental dose-optimization program which includes automated exposure control, adjustment of the mA and/or kV according to patient size and/or use of iterative reconstruction technique. CONTRAST:  124mL OMNIPAQUE IOHEXOL 300 MG/ML  SOLN COMPARISON:  None Available. FINDINGS: Lower chest: No acute abnormality. Hepatobiliary: The gallbladder is distended. There is questionable mild gallbladder wall thickening. No gallstones are seen. No biliary ductal dilatation. The liver is within normal limits. Pancreas: Unremarkable. No pancreatic ductal dilatation or surrounding inflammatory changes. Spleen: Normal in size without focal abnormality. Adrenals/Urinary Tract: Adrenal glands are unremarkable. Kidneys are normal, without renal calculi, focal lesion, or hydronephrosis. Bladder is unremarkable. Stomach/Bowel: Stomach is within normal limits. Appendix appears normal. No evidence of bowel wall thickening, distention, or inflammatory changes. Vascular/Lymphatic: No significant vascular findings are present. No enlarged abdominal or pelvic lymph nodes. Reproductive: There is an IUD in the uterus. There is a collapsing cyst or follicle in the right ovary measuring 16 mm. Left ovary within normal limits. Other: There is a small amount of free fluid in the right adnexa and pelvis. No focal abdominal wall hernia free Musculoskeletal: No acute or significant osseous findings. IMPRESSION: 1. Gallbladder distension with questionable mild  wall thickening. Please correlate clinically for cholecystitis. Consider further evaluation with ultrasound. 2. Small amount of free fluid in the right adnexa and pelvis. 3. Collapsing cyst or follicle in the right ovary. Electronically Signed   By: Ronney Asters M.D.   On: 03/30/2022 20:55    ROS:  Pertinent items are noted in HPI.  Blood pressure 110/68, pulse 96, temperature 98.3  F (36.8 C), resp. rate 18, height 5\' 1"  (1.549 m), weight 67.3 kg, last menstrual period 03/07/2022, SpO2 100 %. Physical Exam: Well-developed well-nourished white female no acute distress Head is normocephalic, atraumatic Lungs clear to auscultation with equal breath sounds bilaterally Heart examination reveals regular rate and rhythm without S3, S4, murmurs Abdomen is soft with tenderness noted in the right upper quadrant to palpation.  No hepatosplenomegaly, masses, or hernias identified.  CT scan images personally reviewed  Assessment/Plan: Impression: Acute cholecystitis, cholelithiasis Plan: Patient has been started on IV antibiotics.  Will get control of her pain and nausea.  She was subsequently undergo a robotic assisted laparoscopic cholecystectomy in the morning.  The risks and benefits of the procedure including bleeding, infection, hepatobiliary injury, and the possibility of an open procedure were fully explained to the patient, who gave informed consent. Aviva Signs 03/31/2022, 10:15 AM

## 2022-04-01 ENCOUNTER — Encounter (HOSPITAL_COMMUNITY)
Admission: EM | Disposition: A | Discharge status: Home / Self Care | Payer: Self-pay | Source: Home / Self Care | Attending: Emergency Medicine

## 2022-04-01 ENCOUNTER — Observation Stay (HOSPITAL_COMMUNITY): Payer: BC Managed Care – PPO | Admitting: Anesthesiology

## 2022-04-01 ENCOUNTER — Encounter (HOSPITAL_COMMUNITY): Payer: Self-pay | Admitting: Family Medicine

## 2022-04-01 SURGERY — CHOLECYSTECTOMY, ROBOT-ASSISTED, LAPAROSCOPIC
Anesthesia: General

## 2022-04-01 MED ORDER — MIDAZOLAM HCL 2 MG/2ML IJ SOLN
INTRAMUSCULAR | Status: AC
  Filled 2022-04-01: qty 2

## 2022-04-01 MED ORDER — BUPIVACAINE HCL (PF) 0.5 % IJ SOLN
INTRAMUSCULAR | Status: AC
  Filled 2022-04-01: qty 30

## 2022-04-01 MED ORDER — DEXAMETHASONE SODIUM PHOSPHATE 10 MG/ML IJ SOLN
INTRAMUSCULAR | Status: AC
  Filled 2022-04-01: qty 1

## 2022-04-01 MED ORDER — PROPOFOL 10 MG/ML IV BOLUS
INTRAVENOUS | Status: AC
  Filled 2022-04-01: qty 20

## 2022-04-01 MED ORDER — FENTANYL CITRATE (PF) 100 MCG/2ML IJ SOLN
INTRAMUSCULAR | Status: DC | PRN
  Administered 2022-04-01 (×5): 50 ug via INTRAVENOUS

## 2022-04-01 MED ORDER — ACETAMINOPHEN 325 MG PO TABS: 650.0000 mg | ORAL_TABLET | Freq: Four times a day (QID) | ORAL | Status: DC | PRN

## 2022-04-01 MED ORDER — PHENYLEPHRINE 80 MCG/ML (10ML) SYRINGE FOR IV PUSH (FOR BLOOD PRESSURE SUPPORT)
PREFILLED_SYRINGE | INTRAVENOUS | Status: DC | PRN
  Administered 2022-04-01 (×2): 80 ug via INTRAVENOUS
  Administered 2022-04-01: 40 ug via INTRAVENOUS

## 2022-04-01 MED ORDER — METOCLOPRAMIDE HCL 5 MG/ML IJ SOLN
INTRAMUSCULAR | Status: AC
  Filled 2022-04-01: qty 2

## 2022-04-01 MED ORDER — SCOPOLAMINE 1 MG/3DAYS TD PT72
1.0000 | MEDICATED_PATCH | Freq: Once | TRANSDERMAL | Status: DC
  Administered 2022-04-01: 1.5 mg via TRANSDERMAL

## 2022-04-01 MED ORDER — ONDANSETRON HCL 4 MG/2ML IJ SOLN
INTRAMUSCULAR | Status: AC
  Filled 2022-04-01: qty 2

## 2022-04-01 MED ORDER — CHLORHEXIDINE GLUCONATE 0.12 % MT SOLN
15.0000 mL | Freq: Once | OROMUCOSAL | Status: AC
  Administered 2022-04-01: 15 mL via OROMUCOSAL

## 2022-04-01 MED ORDER — TRAMADOL HCL 50 MG PO TABS: 50.0000 mg | ORAL_TABLET | Freq: Four times a day (QID) | ORAL | Status: DC | PRN

## 2022-04-01 MED ORDER — INDOCYANINE GREEN 25 MG IV SOLR
INTRAVENOUS | Status: AC
  Filled 2022-04-01: qty 10

## 2022-04-01 MED ORDER — TRAMADOL HCL 50 MG PO TABS: 50.0000 mg | ORAL_TABLET | Freq: Four times a day (QID) | ORAL | 0 refills | Status: AC | PRN

## 2022-04-01 MED ORDER — SUGAMMADEX SODIUM 200 MG/2ML IV SOLN
INTRAVENOUS | Status: DC | PRN
  Administered 2022-04-01: 200 mg via INTRAVENOUS

## 2022-04-01 MED ORDER — METOCLOPRAMIDE HCL 5 MG/ML IJ SOLN
10.0000 mg | Freq: Once | INTRAMUSCULAR | Status: AC
  Administered 2022-04-01: 10 mg via INTRAVENOUS

## 2022-04-01 MED ORDER — SCOPOLAMINE 1 MG/3DAYS TD PT72
MEDICATED_PATCH | TRANSDERMAL | Status: AC
  Filled 2022-04-01: qty 1

## 2022-04-01 MED ORDER — KETOROLAC TROMETHAMINE 30 MG/ML IJ SOLN
INTRAMUSCULAR | Status: AC
  Filled 2022-04-01: qty 1

## 2022-04-01 MED ORDER — FENTANYL CITRATE (PF) 100 MCG/2ML IJ SOLN
INTRAMUSCULAR | Status: AC
  Filled 2022-04-01: qty 2

## 2022-04-01 MED ORDER — ACETAMINOPHEN 650 MG RE SUPP: 650.0000 mg | Freq: Four times a day (QID) | RECTAL | Status: DC | PRN

## 2022-04-01 MED ORDER — DIPHENHYDRAMINE HCL 25 MG PO CAPS: 25.0000 mg | ORAL_CAPSULE | Freq: Four times a day (QID) | ORAL | Status: DC | PRN

## 2022-04-01 MED ORDER — LIDOCAINE HCL (CARDIAC) PF 100 MG/5ML IV SOSY
PREFILLED_SYRINGE | INTRAVENOUS | Status: DC | PRN
  Administered 2022-04-01: 80 mg via INTRATRACHEAL

## 2022-04-01 MED ORDER — KETOROLAC TROMETHAMINE 30 MG/ML IJ SOLN: 30.0000 mg | Freq: Four times a day (QID) | INTRAMUSCULAR | Status: DC | PRN

## 2022-04-01 MED ORDER — STERILE WATER FOR IRRIGATION IR SOLN
Status: DC | PRN
  Administered 2022-04-01: 1000 mL

## 2022-04-01 MED ORDER — LIDOCAINE HCL (PF) 2 % IJ SOLN
INTRAMUSCULAR | Status: AC
  Filled 2022-04-01: qty 5

## 2022-04-01 MED ORDER — ONDANSETRON 4 MG PO TBDP: 4.0000 mg | ORAL_TABLET | Freq: Four times a day (QID) | ORAL | Status: DC | PRN

## 2022-04-01 MED ORDER — SUCCINYLCHOLINE CHLORIDE 200 MG/10ML IV SOSY
PREFILLED_SYRINGE | INTRAVENOUS | Status: AC
  Filled 2022-04-01: qty 10

## 2022-04-01 MED ORDER — SODIUM CHLORIDE 0.9 % IR SOLN
Status: DC | PRN
  Administered 2022-04-01: 1000 mL

## 2022-04-01 MED ORDER — DEXMEDETOMIDINE HCL IN NACL 80 MCG/20ML IV SOLN
INTRAVENOUS | Status: DC | PRN
  Administered 2022-04-01: 8 ug via BUCCAL

## 2022-04-01 MED ORDER — ROCURONIUM BROMIDE 10 MG/ML (PF) SYRINGE
PREFILLED_SYRINGE | INTRAVENOUS | Status: DC | PRN
  Administered 2022-04-01: 10 mg via INTRAVENOUS
  Administered 2022-04-01: 60 mg via INTRAVENOUS

## 2022-04-01 MED ORDER — FENTANYL CITRATE (PF) 250 MCG/5ML IJ SOLN
INTRAMUSCULAR | Status: AC
  Filled 2022-04-01: qty 5

## 2022-04-01 MED ORDER — ORAL CARE MOUTH RINSE: 15.0000 mL | Freq: Once | OROMUCOSAL | Status: AC

## 2022-04-01 MED ORDER — KETOROLAC TROMETHAMINE 30 MG/ML IJ SOLN: 30.0000 mg | Freq: Once | INTRAMUSCULAR | Status: DC

## 2022-04-01 MED ORDER — ONDANSETRON HCL 4 MG/2ML IJ SOLN: 4.0000 mg | Freq: Four times a day (QID) | INTRAMUSCULAR | Status: DC | PRN

## 2022-04-01 MED ORDER — DIPHENHYDRAMINE HCL 50 MG/ML IJ SOLN: 25.0000 mg | Freq: Four times a day (QID) | INTRAMUSCULAR | Status: DC | PRN

## 2022-04-01 MED ORDER — DEXAMETHASONE SODIUM PHOSPHATE 10 MG/ML IJ SOLN
INTRAMUSCULAR | Status: DC | PRN
  Administered 2022-04-01: 10 mg via INTRAVENOUS

## 2022-04-01 MED ORDER — ROCURONIUM BROMIDE 10 MG/ML (PF) SYRINGE
PREFILLED_SYRINGE | INTRAVENOUS | Status: AC
  Filled 2022-04-01: qty 10

## 2022-04-01 MED ORDER — BUPIVACAINE HCL (PF) 0.5 % IJ SOLN
INTRAMUSCULAR | Status: DC | PRN
  Administered 2022-04-01: 20 mL

## 2022-04-01 MED ORDER — LACTATED RINGERS IV SOLN: INTRAVENOUS | Status: DC

## 2022-04-01 MED ORDER — MORPHINE SULFATE (PF) 4 MG/ML IV SOLN
INTRAVENOUS | Status: AC
  Filled 2022-04-01: qty 1

## 2022-04-01 MED ORDER — INDOCYANINE GREEN 25 MG IV SOLR
7.5000 mg | Freq: Once | INTRAVENOUS | Status: AC
  Administered 2022-04-01: 7.5 mg via INTRAVENOUS
  Filled 2022-04-01 (×2): qty 10

## 2022-04-01 MED ORDER — MIDAZOLAM HCL 2 MG/2ML IJ SOLN
2.0000 mg | Freq: Once | INTRAMUSCULAR | Status: AC
  Administered 2022-04-01: 2 mg via INTRAVENOUS

## 2022-04-01 MED ORDER — DEXMEDETOMIDINE HCL IN NACL 80 MCG/20ML IV SOLN
INTRAVENOUS | Status: AC
  Filled 2022-04-01: qty 20

## 2022-04-01 MED ORDER — KETOROLAC TROMETHAMINE 30 MG/ML IJ SOLN
30.0000 mg | Freq: Four times a day (QID) | INTRAMUSCULAR | Status: AC
  Administered 2022-04-01: 30 mg via INTRAVENOUS

## 2022-04-01 MED ORDER — ONDANSETRON HCL 4 MG PO TABS: 4.0000 mg | ORAL_TABLET | Freq: Three times a day (TID) | ORAL | 0 refills | Status: AC | PRN

## 2022-04-01 MED ORDER — ENOXAPARIN SODIUM 40 MG/0.4ML IJ SOSY: 40.0000 mg | PREFILLED_SYRINGE | INTRAMUSCULAR | Status: DC

## 2022-04-01 MED ORDER — MORPHINE SULFATE (PF) 4 MG/ML IV SOLN
4.0000 mg | INTRAVENOUS | Status: DC | PRN
  Administered 2022-04-01: 4 mg via INTRAVENOUS

## 2022-04-01 MED ORDER — PROPOFOL 10 MG/ML IV BOLUS
INTRAVENOUS | Status: DC | PRN
  Administered 2022-04-01: 120 mg via INTRAVENOUS
  Administered 2022-04-01: 30 mg via INTRAVENOUS

## 2022-04-01 SURGICAL SUPPLY — 52 items
CAUTERY HOOK 1.6MM DA VINCI XI (INSTRUMENTS) ×1
CAUTERY HOOK MNPLR 1.6 DVNC XI (INSTRUMENTS) ×1 IMPLANT
CHLORAPREP W/TINT 26 (MISCELLANEOUS) ×1 IMPLANT
CLIP LIGATING HEM O LOK PURPLE (MISCELLANEOUS) ×1 IMPLANT
COVER MAYO STAND XLG (MISCELLANEOUS) ×1 IMPLANT
COVER TIP SHEARS 8 DVNC (MISCELLANEOUS) ×1 IMPLANT
COVER TIP SHEARS 8MM DA VINCI (MISCELLANEOUS) ×1
DEFOGGER SCOPE WARMER CLEARIFY (MISCELLANEOUS) IMPLANT
DERMABOND ADVANCED .7 DNX12 (GAUZE/BANDAGES/DRESSINGS) ×1 IMPLANT
DERMABOND ADVANCED .7 DNX6 (GAUZE/BANDAGES/DRESSINGS) IMPLANT
DRAPE ARM DVNC X/XI (DISPOSABLE) ×4 IMPLANT
DRAPE COLUMN DVNC XI (DISPOSABLE) ×1 IMPLANT
DRAPE DA VINCI XI ARM (DISPOSABLE) ×4
DRAPE DA VINCI XI COLUMN (DISPOSABLE) ×1
DRAPE HALF SHEET 40X57 (DRAPES) ×1 IMPLANT
ELECT REM PT RETURN 9FT ADLT (ELECTROSURGICAL) ×1
ELECTRODE REM PT RTRN 9FT ADLT (ELECTROSURGICAL) ×1 IMPLANT
FORCEPS BPLR R/ABLATION 8 DVNC (INSTRUMENTS) ×1 IMPLANT
FORCEPS PROGRASP DVNC XI (FORCEP) ×1 IMPLANT
FORCEPS XI PROGRASP DA VINCI (FORCEP) ×1
GLOVE BIOGEL M 6.5 STRL (GLOVE) IMPLANT
GLOVE BIOGEL PI IND STRL 6.5 (GLOVE) IMPLANT
GLOVE BIOGEL PI IND STRL 7.0 (GLOVE) ×2 IMPLANT
GLOVE SURG SS PI 7.5 STRL IVOR (GLOVE) ×2 IMPLANT
GOWN STRL REUS W/TWL LRG LVL3 (GOWN DISPOSABLE) ×3 IMPLANT
GRASPER SUT TROCAR 14GX15 (MISCELLANEOUS) IMPLANT
IRRIGATOR SUCT 8 DISP DVNC XI (IRRIGATION / IRRIGATOR) IMPLANT
IRRIGATOR SUCTION 8MM XI DISP (IRRIGATION / IRRIGATOR) ×1
IV NS IRRIG 3000ML ARTHROMATIC (IV SOLUTION) IMPLANT
KIT TURNOVER KIT A (KITS) ×1 IMPLANT
MANIFOLD NEPTUNE II (INSTRUMENTS) ×1 IMPLANT
NDL HYPO 21X1.5 SAFETY (NEEDLE) ×1 IMPLANT
NDL INSUFFLATION 14GA 120MM (NEEDLE) ×1 IMPLANT
NEEDLE HYPO 21X1.5 SAFETY (NEEDLE) ×1 IMPLANT
NEEDLE INSUFFLATION 14GA 120MM (NEEDLE) ×1 IMPLANT
OBTURATOR OPTICAL STANDARD 8MM (TROCAR) ×1
OBTURATOR OPTICAL STND 8 DVNC (TROCAR) ×1
OBTURATOR OPTICALSTD 8 DVNC (TROCAR) ×1 IMPLANT
PACK LAP CHOLE LZT030E (CUSTOM PROCEDURE TRAY) ×1 IMPLANT
PAD ARMBOARD 7.5X6 YLW CONV (MISCELLANEOUS) ×1 IMPLANT
PENCIL HANDSWITCHING (ELECTRODE) ×1 IMPLANT
SCISSORS MNPLR CVD DVNC XI (INSTRUMENTS) ×1 IMPLANT
SCISSORS XI MNPLR CVD DVNC (INSTRUMENTS) ×1
SEAL CANN UNIV 5-8 DVNC XI (MISCELLANEOUS) ×4 IMPLANT
SEAL XI 5MM-8MM UNIVERSAL (MISCELLANEOUS) ×4
SET TUBE SMOKE EVAC HIGH FLOW (TUBING) ×1 IMPLANT
SPIKE FLUID TRANSFER (MISCELLANEOUS) ×1 IMPLANT
SUT MNCRL AB 4-0 PS2 18 (SUTURE) ×2 IMPLANT
SUT VICRYL 0 AB UR-6 (SUTURE) ×1 IMPLANT
SYS RETRIEVAL 5MM INZII UNIV (BASKET) ×1
SYSTEM RETRIEVL 5MM INZII UNIV (BASKET) ×1 IMPLANT
WATER STERILE IRR 500ML POUR (IV SOLUTION) ×1 IMPLANT

## 2022-04-01 NOTE — Anesthesia Postprocedure Evaluation (Signed)
Anesthesia Post Note  Patient: Alice Ward  Procedure(s) Performed: XI ROBOTIC ASSISTED LAPAROSCOPIC CHOLECYSTECTOMY  Patient location during evaluation: PACU Anesthesia Type: General Level of consciousness: awake and alert, oriented and sedated Pain management: pain level controlled Vital Signs Assessment: post-procedure vital signs reviewed and stable Respiratory status: spontaneous breathing, nonlabored ventilation and respiratory function stable Cardiovascular status: blood pressure returned to baseline and stable Postop Assessment: no apparent nausea or vomiting Anesthetic complications: no  No notable events documented.   Last Vitals:  Vitals:   04/01/22 1400 04/01/22 1415  BP: 111/68 109/67  Pulse: 93 79  Resp: 17 11  Temp:    SpO2: 100% 98%    Last Pain:  Vitals:   04/01/22 1425  TempSrc:   PainSc: Asleep                 Katarina Riebe C Marjory Meints

## 2022-04-01 NOTE — Op Note (Signed)
Patient:  Alice Ward  DOB:  09/28/1995  MRN:  [id number]   Preop Diagnosis: Acute cholecystitis, cholelithiasis  Postop Diagnosis: Same  Procedure: Robotic assisted laparoscopic cholecystectomy  Surgeon: Aviva Signs, MD  Anes: General endotracheal  Indications: Patient is a 27 year old white female who presents with acute cholecystitis secondary to cholelithiasis.  The risks and benefits of the procedure including bleeding, infection, hepatobiliary injury, and the possibility of an open procedure were fully explained to the patient, who gave informed consent.  Procedure note: The patient was placed in the supine position.  After induction of general endotracheal anesthesia, the abdomen was prepped and draped using the usual sterile technique with ChloraPrep.  Surgical site confirmation was performed.  An infraumbilical incision was made down to the fascia.  A Veress needle was introduced into the abdominal cavity and confirmation of placement was done using the saline drop test.  The abdomen was then insufflated to 15 mmHg pressure.  An 8 mm trocar was introduced into the abdominal cavity under direct visualization without difficulty.  Additional 8 mm trocars were placed on the right flank, right mid abdomen, and left upper quadrant regions.  The patient was placed in reverse Trendelenburg position.  The robot was then targeted and docked.  The gallbladder was noted to be distended and tense with a thickened edematous gallbladder wall.  It had to be decompressed using suction in order to facilitate exposure.  The gallbladder was then retracted in a dynamic fashion in order to provide a critical view of the triangle of [place]ot.  I partially had to use a dome down approach in order to expose the triangle of Calot.  I ultimately was able to confirm the cystic duct with firefly.  The juncture of the infundibulum was fully identified.  Hem-o-lok clips were placed proximally and distally on the cystic  duct and the cystic duct was divided.  This was likewise done to the cystic artery.  The gallbladder was then freed away from the gallbladder fossa using Bovie electrocautery.  The gallbladder was delivered through the left upper quadrant trocar site without difficulty.  The right upper quadrant was copiously irrigated normal saline.  All fluid and air were then evacuated from the abdominal cavity.  The robot was then undocked and all trocars were removed.  All wounds were irrigated with normal saline.  All wounds were injected with 0.5% Sensorcaine.  The infraumbilical fascia was reapproximated using an 0 Vicryl interrupted suture.  All skin incisions were closed using a 4-0 Monocryl subcuticular suture.  Dermabond was applied.  All tape and needle counts were correct at the end of the procedure.  The patient was extubated in the operating room and transferred to PACU in stable condition.  Complications: None  EBL: Minimal  Specimen: None

## 2022-04-01 NOTE — Interval H&P Note (Signed)
History and Physical Interval Note:  04/01/2022 10:32 AM  Alice Ward  has presented today for surgery, with the diagnosis of acute cholecystitis, cholelithiasis.  The various methods of treatment have been discussed with the patient and family. After consideration of risks, benefits and other options for treatment, the patient has consented to  Procedure(s): XI ROBOTIC [place] (N/A) as a surgical intervention.  The patient's history has been reviewed, patient examined, no change in status, stable for surgery.  I have reviewed the patient's chart and labs.  Questions were answered to the patient's satisfaction.     Aviva Signs

## 2022-04-01 NOTE — Transfer of Care (Signed)
Immediate Anesthesia Transfer of Care Note  Patient: Alice Ward  Procedure(s) Performed: XI ROBOTIC ASSISTED LAPAROSCOPIC CHOLECYSTECTOMY  Patient Location: PACU  Anesthesia Type:General  Level of Consciousness: awake, alert , oriented, and sedated  Airway & Oxygen Therapy: Patient Spontanous Breathing  Post-op Assessment: Report given to RN and Post -op Vital signs reviewed and stable  Post vital signs: Reviewed and stable  Last Vitals:  Vitals Value Taken Time  BP 111/68 04/01/22 1400  Temp 36.7 C 04/01/22 1355  Pulse 71 04/01/22 1411  Resp 10 04/01/22 1411  SpO2 97 % 04/01/22 1411  Vitals shown include unvalidated device data.  Last Pain:  Vitals:   04/01/22 1355  TempSrc:   PainSc: 0-No pain      Patients Stated Pain Goal: 3 ([id number])  Complications: No notable events documented.

## 2022-04-01 NOTE — Anesthesia Preprocedure Evaluation (Signed)
Anesthesia Evaluation  Patient identified by MRN, date of birth, ID band Patient awake    Reviewed: Allergy & Precautions, H&P , NPO status , Patient's Chart, lab work & pertinent test results, reviewed documented beta blocker date and time   History of Anesthesia Complications (+) PONV and history of anesthetic complications  Airway Mallampati: II  TM Distance: >3 FB Neck ROM: Full    Dental  (+) Dental Advisory Given, Implants   Pulmonary shortness of breath   Pulmonary exam normal breath sounds clear to auscultation       Cardiovascular hypertension, Pt. on medications and Pt. on home beta blockers Normal cardiovascular exam Rhythm:Regular Rate:Normal     Neuro/Psych  PSYCHIATRIC DISORDERS Anxiety Depression    negative neurological ROS     GI/Hepatic Neg liver ROS,GERD  Medicated and Poorly Controlled,,  Endo/Other  negative endocrine ROS    Renal/GU negative Renal ROS  negative genitourinary   Musculoskeletal negative musculoskeletal ROS (+)    Abdominal   Peds negative pediatric ROS (+)  Hematology negative hematology ROS (+)   Anesthesia Other Findings   Reproductive/Obstetrics negative OB ROS                              Anesthesia Physical Anesthesia Plan  ASA: 2  Anesthesia Plan: General   Post-op Pain Management: Dilaudid IV   Induction: Intravenous  PONV Risk Score and Plan: 4 or greater and Ondansetron, Dexamethasone, Midazolam, Scopolamine patch - Pre-op and Metaclopromide  Airway Management Planned: Oral ETT  Additional Equipment:   Intra-op Plan:   Post-operative Plan: Extubation in OR  Informed Consent: I have reviewed the patients History and Physical, chart, labs and discussed the procedure including the risks, benefits and alternatives for the proposed anesthesia with the patient or authorized representative who has indicated his/her understanding and  acceptance.     Dental advisory given  Plan Discussed with: CRNA and Surgeon  Anesthesia Plan Comments:          Anesthesia Quick Evaluation

## 2022-04-01 NOTE — Anesthesia Procedure Notes (Signed)
Procedure Name: Intubation Date/Time: 04/01/2022 11:52 AM  Performed by: Denese Killings, MDPre-anesthesia Checklist: Patient identified, Emergency Drugs available, Suction available and Patient being monitored Patient Re-evaluated:Patient Re-evaluated prior to induction Oxygen Delivery Method: Circle system utilized Preoxygenation: Pre-oxygenation with 100% oxygen Induction Type: IV induction Ventilation: Mask ventilation without difficulty Laryngoscope Size: Mac and 3 Grade View: Grade II Tube type: Oral Tube size: 7.0 mm Number of attempts: 1 Airway Equipment and Method: Stylet and Oral airway Placement Confirmation: ETT inserted through vocal cords under direct vision, positive ETCO2 and breath sounds checked- equal and bilateral Secured at: 21 (at lip) cm Tube secured with: Tape Dental Injury: Teeth and Oropharynx as per pre-operative assessment

## 2022-04-02 NOTE — Discharge Summary (Signed)
Physician Discharge Summary  Patient ID: Alice Ward MRN: [id number] DOB/AGE: 1995-07-27 27 y.o.  Admit date: 03/30/2022 Discharge date: 04/01/2022  Admission Diagnoses: Acute cholecystitis, cholelithiasis  Discharge Diagnoses:  Principal Problem:   Calculus of gallbladder with acute cholecystitis without obstruction   Discharged Condition: good  Hospital Course: Patient is a 27 year old white female who presented to the emergency room on 03/30/2022 with worsening right upper quadrant abdominal pain and nausea.  She was admitted by the hospitalist for control of her pain and nausea.  The following day, and ultrasound the gallbladder revealed cholelithiasis with a thickened gallbladder wall.  She subsequently underwent a robotic assisted laparoscopic cholecystectomy on 04/01/2022.  She tolerated the surgery well.  Her postoperative course was unremarkable.  Her diet was advanced out difficulty.  The patient was discharged home on 04/01/2022 in good and improving condition.  Treatments: surgery: Robotic assisted laparoscopic cholecystectomy on 04/01/2022  Discharge Exam: Blood pressure 110/75, pulse 96, temperature (!) 97.5 F (36.4 C), temperature source Oral, resp. rate 18, height 5\' 1"  (1.549 m), weight 67.3 kg, last menstrual period 03/07/2022, SpO2 99 %. 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 Rash / blisters around lips.   Clindamycin Hives, Itching   Hydrocodone  Itching   Oxycodone Hives, Rash   Amoxicillin Hives   Other Itching   Itch inside throat and around mouth 2 different times she had cashew which is a tree nut   Penicillins Hives        Medication List     TAKE these medications    albuterol 108 (90 Base) MCG/ACT inhaler Commonly known as: VENTOLIN HFA Inhale 1-2 puffs into the lungs every 4 (four) hours as needed for wheezing or shortness of breath.   budesonide-formoterol 80-4.5 MCG/ACT inhaler Commonly known as: SYMBICORT Inhale 2 puffs into the lungs 2 (two) times daily.   cyclobenzaprine 10 MG tablet Commonly known as: FLEXERIL Take 10 mg by mouth 2 (two) times daily as needed for muscle spasms.   escitalopram 10 MG tablet Commonly known as: LEXAPRO Take 10 mg by mouth daily.   methylphenidate 10 MG 24 hr capsule Commonly known as: RITALIN LA Take 10 mg by mouth daily.   metoprolol succinate 25 MG 24 hr tablet Commonly known as: TOPROL-XL Take 25 mg by mouth daily.   ondansetron 4 MG tablet Commonly known as: Zofran Take 1 tablet (4 mg total) by mouth every 8 (eight) hours as needed for nausea or vomiting.   pregabalin 50 MG capsule Commonly known as: LYRICA Take 50 mg by mouth daily.   traMADol 50 MG tablet Commonly known as: Ultram Take 1 tablet (50 mg total) by mouth every 6 (six) hours as needed.        Follow-up Information     Aviva Signs, MD Follow up.   Specialty: General Surgery Why: As needed.  Will call you in two weeks for follow up. Contact information:  [address] [place] [id number] [phone]                 Signed: Aviva Signs 04/02/2022, 10:07 AM

## 2022-04-05 ENCOUNTER — Encounter: Payer: Self-pay | Admitting: *Deleted

## 2022-04-05 LAB — SURGICAL PATHOLOGY

## 2022-04-28 ENCOUNTER — Telehealth: Payer: Self-pay | Admitting: Family Medicine

## 2022-04-28 NOTE — Telephone Encounter (Signed)
Patient brought in paperwork on 04/27/2022 to be filled out for spouse.  FMLA paperwork for Alice Ward completed and faxed to the standard at [phone]. Confirmation received.   Per Alice Ward dates he was out of work are 04/03/2022 through 04/07/2022.

## 2022-12-09 ENCOUNTER — Other Ambulatory Visit: Payer: Self-pay | Admitting: General Surgery

## 2023-01-28 ENCOUNTER — Other Ambulatory Visit: Payer: Self-pay | Admitting: General Surgery

## 2023-01-29 NOTE — Telephone Encounter (Signed)
Have not seen patient.

## 2023-03-09 DIAGNOSIS — Z3A15 15 weeks gestation of pregnancy: Secondary | ICD-10-CM | POA: Diagnosis not present

## 2023-03-09 DIAGNOSIS — R519 Headache, unspecified: Secondary | ICD-10-CM | POA: Diagnosis not present

## 2023-03-09 DIAGNOSIS — O99891 Other specified diseases and conditions complicating pregnancy: Secondary | ICD-10-CM | POA: Diagnosis not present

## 2023-03-09 DIAGNOSIS — Z3689 Encounter for other specified antenatal screening: Secondary | ICD-10-CM | POA: Diagnosis not present

## 2023-04-06 DIAGNOSIS — Z3689 Encounter for other specified antenatal screening: Secondary | ICD-10-CM | POA: Diagnosis not present

## 2023-04-06 DIAGNOSIS — O361120 Maternal care for Anti-A sensitization, second trimester, not applicable or unspecified: Secondary | ICD-10-CM | POA: Diagnosis not present

## 2023-04-06 DIAGNOSIS — O4422 Partial placenta previa NOS or without hemorrhage, second trimester: Secondary | ICD-10-CM | POA: Diagnosis not present

## 2023-04-06 DIAGNOSIS — R768 Other specified abnormal immunological findings in serum: Secondary | ICD-10-CM | POA: Diagnosis not present

## 2023-04-06 DIAGNOSIS — Z3A19 19 weeks gestation of pregnancy: Secondary | ICD-10-CM | POA: Diagnosis not present

## 2023-04-06 DIAGNOSIS — O3506X0 Maternal care for (suspected) central nervous system malformation or damage in fetus, hydrocephaly, not applicable or unspecified: Secondary | ICD-10-CM | POA: Diagnosis not present

## 2023-04-27 DIAGNOSIS — O3506X0 Maternal care for (suspected) central nervous system malformation or damage in fetus, hydrocephaly, not applicable or unspecified: Secondary | ICD-10-CM | POA: Diagnosis not present

## 2023-04-27 DIAGNOSIS — R768 Other specified abnormal immunological findings in serum: Secondary | ICD-10-CM | POA: Diagnosis not present

## 2023-04-27 DIAGNOSIS — Z3A22 22 weeks gestation of pregnancy: Secondary | ICD-10-CM | POA: Diagnosis not present

## 2023-04-27 DIAGNOSIS — O10012 Pre-existing essential hypertension complicating pregnancy, second trimester: Secondary | ICD-10-CM | POA: Diagnosis not present

## 2023-04-27 DIAGNOSIS — O283 Abnormal ultrasonic finding on antenatal screening of mother: Secondary | ICD-10-CM | POA: Diagnosis not present

## 2023-04-27 DIAGNOSIS — Z363 Encounter for antenatal screening for malformations: Secondary | ICD-10-CM | POA: Diagnosis not present

## 2023-05-08 DIAGNOSIS — R768 Other specified abnormal immunological findings in serum: Secondary | ICD-10-CM | POA: Diagnosis not present

## 2023-05-08 DIAGNOSIS — Z3A24 24 weeks gestation of pregnancy: Secondary | ICD-10-CM | POA: Diagnosis not present

## 2023-05-21 DIAGNOSIS — Z362 Encounter for other antenatal screening follow-up: Secondary | ICD-10-CM | POA: Diagnosis not present

## 2023-05-21 DIAGNOSIS — Z3A26 26 weeks gestation of pregnancy: Secondary | ICD-10-CM | POA: Diagnosis not present

## 2023-05-21 DIAGNOSIS — O283 Abnormal ultrasonic finding on antenatal screening of mother: Secondary | ICD-10-CM | POA: Diagnosis not present

## 2023-05-21 DIAGNOSIS — O10012 Pre-existing essential hypertension complicating pregnancy, second trimester: Secondary | ICD-10-CM | POA: Diagnosis not present

## 2023-06-05 DIAGNOSIS — R768 Other specified abnormal immunological findings in serum: Secondary | ICD-10-CM | POA: Diagnosis not present

## 2023-06-05 DIAGNOSIS — Z3A28 28 weeks gestation of pregnancy: Secondary | ICD-10-CM | POA: Diagnosis not present

## 2023-06-05 DIAGNOSIS — Z3689 Encounter for other specified antenatal screening: Secondary | ICD-10-CM | POA: Diagnosis not present

## 2023-06-05 DIAGNOSIS — O361930 Maternal care for other isoimmunization, third trimester, not applicable or unspecified: Secondary | ICD-10-CM | POA: Diagnosis not present

## 2023-06-18 DIAGNOSIS — O361130 Maternal care for Anti-A sensitization, third trimester, not applicable or unspecified: Secondary | ICD-10-CM | POA: Diagnosis not present

## 2023-06-18 DIAGNOSIS — O3506X0 Maternal care for (suspected) central nervous system malformation or damage in fetus, hydrocephaly, not applicable or unspecified: Secondary | ICD-10-CM | POA: Diagnosis not present

## 2023-06-18 DIAGNOSIS — O361930 Maternal care for other isoimmunization, third trimester, not applicable or unspecified: Secondary | ICD-10-CM | POA: Diagnosis not present

## 2023-06-18 DIAGNOSIS — O4423 Partial placenta previa NOS or without hemorrhage, third trimester: Secondary | ICD-10-CM | POA: Diagnosis not present

## 2023-06-18 DIAGNOSIS — O10013 Pre-existing essential hypertension complicating pregnancy, third trimester: Secondary | ICD-10-CM | POA: Diagnosis not present

## 2023-06-18 DIAGNOSIS — Z3A30 30 weeks gestation of pregnancy: Secondary | ICD-10-CM | POA: Diagnosis not present

## 2023-06-18 DIAGNOSIS — Z3689 Encounter for other specified antenatal screening: Secondary | ICD-10-CM | POA: Diagnosis not present

## 2023-06-22 DIAGNOSIS — Z363 Encounter for antenatal screening for malformations: Secondary | ICD-10-CM | POA: Diagnosis not present

## 2023-06-22 DIAGNOSIS — Z3A30 30 weeks gestation of pregnancy: Secondary | ICD-10-CM | POA: Diagnosis not present

## 2023-07-03 DIAGNOSIS — Z3689 Encounter for other specified antenatal screening: Secondary | ICD-10-CM | POA: Diagnosis not present

## 2023-07-03 DIAGNOSIS — O361930 Maternal care for other isoimmunization, third trimester, not applicable or unspecified: Secondary | ICD-10-CM | POA: Diagnosis not present

## 2023-07-03 DIAGNOSIS — O10013 Pre-existing essential hypertension complicating pregnancy, third trimester: Secondary | ICD-10-CM | POA: Diagnosis not present

## 2023-07-03 DIAGNOSIS — Z3A32 32 weeks gestation of pregnancy: Secondary | ICD-10-CM | POA: Diagnosis not present

## 2023-07-03 DIAGNOSIS — R768 Other specified abnormal immunological findings in serum: Secondary | ICD-10-CM | POA: Diagnosis not present

## 2023-07-10 DIAGNOSIS — O361930 Maternal care for other isoimmunization, third trimester, not applicable or unspecified: Secondary | ICD-10-CM | POA: Diagnosis not present

## 2023-07-10 DIAGNOSIS — O10913 Unspecified pre-existing hypertension complicating pregnancy, third trimester: Secondary | ICD-10-CM | POA: Diagnosis not present

## 2023-07-10 DIAGNOSIS — Z23 Encounter for immunization: Secondary | ICD-10-CM | POA: Diagnosis not present

## 2023-07-10 DIAGNOSIS — Z3A33 33 weeks gestation of pregnancy: Secondary | ICD-10-CM | POA: Diagnosis not present

## 2023-07-17 DIAGNOSIS — O10013 Pre-existing essential hypertension complicating pregnancy, third trimester: Secondary | ICD-10-CM | POA: Diagnosis not present

## 2023-07-17 DIAGNOSIS — O361130 Maternal care for Anti-A sensitization, third trimester, not applicable or unspecified: Secondary | ICD-10-CM | POA: Diagnosis not present

## 2023-07-17 DIAGNOSIS — Z3A34 34 weeks gestation of pregnancy: Secondary | ICD-10-CM | POA: Diagnosis not present

## 2023-07-17 DIAGNOSIS — O361930 Maternal care for other isoimmunization, third trimester, not applicable or unspecified: Secondary | ICD-10-CM | POA: Diagnosis not present

## 2023-07-17 DIAGNOSIS — O4423 Partial placenta previa NOS or without hemorrhage, third trimester: Secondary | ICD-10-CM | POA: Diagnosis not present

## 2023-07-17 DIAGNOSIS — Z3689 Encounter for other specified antenatal screening: Secondary | ICD-10-CM | POA: Diagnosis not present

## 2023-07-17 DIAGNOSIS — O3506X0 Maternal care for (suspected) central nervous system malformation or damage in fetus, hydrocephaly, not applicable or unspecified: Secondary | ICD-10-CM | POA: Diagnosis not present

## 2023-07-20 DIAGNOSIS — Z3A34 34 weeks gestation of pregnancy: Secondary | ICD-10-CM | POA: Diagnosis not present

## 2023-07-20 DIAGNOSIS — O10012 Pre-existing essential hypertension complicating pregnancy, second trimester: Secondary | ICD-10-CM | POA: Diagnosis not present

## 2023-07-20 DIAGNOSIS — Z3689 Encounter for other specified antenatal screening: Secondary | ICD-10-CM | POA: Diagnosis not present

## 2023-07-24 DIAGNOSIS — Z3A35 35 weeks gestation of pregnancy: Secondary | ICD-10-CM | POA: Diagnosis not present

## 2023-07-24 DIAGNOSIS — K219 Gastro-esophageal reflux disease without esophagitis: Secondary | ICD-10-CM | POA: Diagnosis not present

## 2023-07-24 DIAGNOSIS — O10913 Unspecified pre-existing hypertension complicating pregnancy, third trimester: Secondary | ICD-10-CM | POA: Diagnosis not present

## 2023-07-24 DIAGNOSIS — O99613 Diseases of the digestive system complicating pregnancy, third trimester: Secondary | ICD-10-CM | POA: Diagnosis not present

## 2023-07-24 DIAGNOSIS — O361930 Maternal care for other isoimmunization, third trimester, not applicable or unspecified: Secondary | ICD-10-CM | POA: Diagnosis not present

## 2023-07-24 DIAGNOSIS — O361130 Maternal care for Anti-A sensitization, third trimester, not applicable or unspecified: Secondary | ICD-10-CM | POA: Diagnosis not present

## 2023-07-31 DIAGNOSIS — O4423 Partial placenta previa NOS or without hemorrhage, third trimester: Secondary | ICD-10-CM | POA: Diagnosis not present

## 2023-07-31 DIAGNOSIS — O3506X0 Maternal care for (suspected) central nervous system malformation or damage in fetus, hydrocephaly, not applicable or unspecified: Secondary | ICD-10-CM | POA: Diagnosis not present

## 2023-07-31 DIAGNOSIS — O361130 Maternal care for Anti-A sensitization, third trimester, not applicable or unspecified: Secondary | ICD-10-CM | POA: Diagnosis not present

## 2023-07-31 DIAGNOSIS — O10013 Pre-existing essential hypertension complicating pregnancy, third trimester: Secondary | ICD-10-CM | POA: Diagnosis not present

## 2023-07-31 DIAGNOSIS — O09293 Supervision of pregnancy with other poor reproductive or obstetric history, third trimester: Secondary | ICD-10-CM | POA: Diagnosis not present

## 2023-07-31 DIAGNOSIS — Z3A36 36 weeks gestation of pregnancy: Secondary | ICD-10-CM | POA: Diagnosis not present

## 2023-07-31 DIAGNOSIS — Z8659 Personal history of other mental and behavioral disorders: Secondary | ICD-10-CM | POA: Diagnosis not present

## 2023-07-31 DIAGNOSIS — Z3689 Encounter for other specified antenatal screening: Secondary | ICD-10-CM | POA: Diagnosis not present

## 2023-07-31 DIAGNOSIS — O361930 Maternal care for other isoimmunization, third trimester, not applicable or unspecified: Secondary | ICD-10-CM | POA: Diagnosis not present

## 2023-07-31 DIAGNOSIS — O361120 Maternal care for Anti-A sensitization, second trimester, not applicable or unspecified: Secondary | ICD-10-CM | POA: Diagnosis not present

## 2023-08-07 DIAGNOSIS — Z3A37 37 weeks gestation of pregnancy: Secondary | ICD-10-CM | POA: Diagnosis not present

## 2023-08-07 DIAGNOSIS — Z3483 Encounter for supervision of other normal pregnancy, third trimester: Secondary | ICD-10-CM | POA: Diagnosis not present

## 2023-08-07 DIAGNOSIS — O361130 Maternal care for Anti-A sensitization, third trimester, not applicable or unspecified: Secondary | ICD-10-CM | POA: Diagnosis not present

## 2023-08-07 DIAGNOSIS — O10913 Unspecified pre-existing hypertension complicating pregnancy, third trimester: Secondary | ICD-10-CM | POA: Diagnosis not present

## 2023-08-07 DIAGNOSIS — O2243 Hemorrhoids in pregnancy, third trimester: Secondary | ICD-10-CM | POA: Diagnosis not present

## 2023-08-07 DIAGNOSIS — Z3482 Encounter for supervision of other normal pregnancy, second trimester: Secondary | ICD-10-CM | POA: Diagnosis not present

## 2023-08-12 DIAGNOSIS — Z7982 Long term (current) use of aspirin: Secondary | ICD-10-CM | POA: Diagnosis not present

## 2023-08-12 DIAGNOSIS — O9832 Other infections with a predominantly sexual mode of transmission complicating childbirth: Secondary | ICD-10-CM | POA: Diagnosis not present

## 2023-08-12 DIAGNOSIS — O99354 Diseases of the nervous system complicating childbirth: Secondary | ICD-10-CM | POA: Diagnosis not present

## 2023-08-12 DIAGNOSIS — O1002 Pre-existing essential hypertension complicating childbirth: Secondary | ICD-10-CM | POA: Diagnosis not present

## 2023-08-12 DIAGNOSIS — A6009 Herpesviral infection of other urogenital tract: Secondary | ICD-10-CM | POA: Diagnosis not present

## 2023-08-12 DIAGNOSIS — Z3A37 37 weeks gestation of pregnancy: Secondary | ICD-10-CM | POA: Diagnosis not present

## 2023-08-12 DIAGNOSIS — G43909 Migraine, unspecified, not intractable, without status migrainosus: Secondary | ICD-10-CM | POA: Diagnosis not present

## 2023-08-12 DIAGNOSIS — Z87442 Personal history of urinary calculi: Secondary | ICD-10-CM | POA: Diagnosis not present

## 2023-08-12 DIAGNOSIS — O99214 Obesity complicating childbirth: Secondary | ICD-10-CM | POA: Diagnosis not present

## 2023-08-12 DIAGNOSIS — O1092 Unspecified pre-existing hypertension complicating childbirth: Secondary | ICD-10-CM | POA: Diagnosis not present

## 2023-08-12 DIAGNOSIS — O361930 Maternal care for other isoimmunization, third trimester, not applicable or unspecified: Secondary | ICD-10-CM | POA: Diagnosis not present

## 2023-08-12 DIAGNOSIS — Z79899 Other long term (current) drug therapy: Secondary | ICD-10-CM | POA: Diagnosis not present

## 2023-08-12 DIAGNOSIS — O3509X0 Maternal care for (suspected) other central nervous system malformation or damage in fetus, not applicable or unspecified: Secondary | ICD-10-CM | POA: Diagnosis not present

## 2023-08-12 DIAGNOSIS — Z3A38 38 weeks gestation of pregnancy: Secondary | ICD-10-CM | POA: Diagnosis not present

## 2023-08-13 DIAGNOSIS — O1092 Unspecified pre-existing hypertension complicating childbirth: Secondary | ICD-10-CM | POA: Diagnosis not present

## 2023-08-13 DIAGNOSIS — Z3A38 38 weeks gestation of pregnancy: Secondary | ICD-10-CM | POA: Diagnosis not present

## 2023-09-21 DIAGNOSIS — L02215 Cutaneous abscess of perineum: Secondary | ICD-10-CM | POA: Diagnosis not present

## 2023-10-22 DIAGNOSIS — Z975 Presence of (intrauterine) contraceptive device: Secondary | ICD-10-CM | POA: Diagnosis not present
